# Patient Record
Sex: Male | Born: 1945 | Race: White | Hispanic: No | Marital: Married
Health system: Southern US, Community
[De-identification: ages and names within clinical notes are randomized; demographics above are authoritative.]

---

## 2003-03-18 ENCOUNTER — Inpatient Hospital Stay (HOSPITAL_COMMUNITY): Admission: EM | Admit: 2003-03-18 | Discharge: 2003-03-27 | Payer: Self-pay | Admitting: Psychiatry

## 2003-04-13 ENCOUNTER — Other Ambulatory Visit: Payer: Self-pay

## 2003-06-17 ENCOUNTER — Other Ambulatory Visit: Payer: Self-pay

## 2004-03-27 ENCOUNTER — Emergency Department: Payer: Self-pay | Admitting: Emergency Medicine

## 2004-12-25 ENCOUNTER — Emergency Department: Payer: Self-pay | Admitting: Emergency Medicine

## 2005-01-30 ENCOUNTER — Emergency Department: Payer: Self-pay | Admitting: General Practice

## 2006-01-10 ENCOUNTER — Inpatient Hospital Stay: Payer: Self-pay | Admitting: Internal Medicine

## 2006-01-10 ENCOUNTER — Other Ambulatory Visit: Payer: Self-pay

## 2006-09-07 ENCOUNTER — Ambulatory Visit: Payer: Self-pay | Admitting: Internal Medicine

## 2006-09-18 ENCOUNTER — Ambulatory Visit: Payer: Self-pay | Admitting: Internal Medicine

## 2007-01-26 ENCOUNTER — Emergency Department: Payer: Self-pay | Admitting: Internal Medicine

## 2007-01-26 ENCOUNTER — Other Ambulatory Visit: Payer: Self-pay

## 2007-04-17 ENCOUNTER — Emergency Department: Payer: Self-pay | Admitting: Emergency Medicine

## 2007-06-05 ENCOUNTER — Emergency Department: Payer: Self-pay | Admitting: Emergency Medicine

## 2007-06-05 ENCOUNTER — Other Ambulatory Visit: Payer: Self-pay

## 2007-08-04 ENCOUNTER — Emergency Department: Payer: Self-pay | Admitting: Emergency Medicine

## 2008-03-07 ENCOUNTER — Inpatient Hospital Stay: Payer: Self-pay | Admitting: Psychiatry

## 2008-07-03 ENCOUNTER — Inpatient Hospital Stay: Payer: Self-pay | Admitting: Internal Medicine

## 2008-11-04 ENCOUNTER — Ambulatory Visit: Payer: Self-pay | Admitting: Urology

## 2008-11-13 ENCOUNTER — Ambulatory Visit: Payer: Self-pay | Admitting: Urology

## 2009-12-31 ENCOUNTER — Inpatient Hospital Stay: Payer: Self-pay | Admitting: Internal Medicine

## 2010-05-04 ENCOUNTER — Ambulatory Visit: Payer: Self-pay | Admitting: Ophthalmology

## 2010-05-04 DIAGNOSIS — I119 Hypertensive heart disease without heart failure: Secondary | ICD-10-CM

## 2010-05-11 ENCOUNTER — Ambulatory Visit: Payer: Self-pay | Admitting: Ophthalmology

## 2010-08-31 ENCOUNTER — Emergency Department: Payer: Self-pay | Admitting: Emergency Medicine

## 2010-11-11 ENCOUNTER — Emergency Department: Payer: Self-pay | Admitting: Emergency Medicine

## 2011-01-12 ENCOUNTER — Emergency Department: Payer: Self-pay | Admitting: Emergency Medicine

## 2011-02-18 ENCOUNTER — Emergency Department: Payer: Self-pay | Admitting: Emergency Medicine

## 2011-07-12 LAB — CBC WITH DIFFERENTIAL/PLATELET
Basophil #: 0 10*3/uL (ref 0.0–0.1)
HCT: 41.8 % (ref 40.0–52.0)
HGB: 13.6 g/dL (ref 13.0–18.0)
MCH: 32.5 pg (ref 26.0–34.0)
MCHC: 32.6 g/dL (ref 32.0–36.0)
Monocyte #: 0.6 x10 3/mm (ref 0.2–1.0)
Platelet: 150 10*3/uL (ref 150–440)
RDW: 13.7 % (ref 11.5–14.5)
WBC: 6.5 10*3/uL (ref 3.8–10.6)

## 2011-07-12 LAB — URINALYSIS, COMPLETE
Bacteria: NONE SEEN
Blood: NEGATIVE
Leukocyte Esterase: NEGATIVE
Nitrite: NEGATIVE
Protein: 100
RBC,UR: 2 /HPF (ref 0–5)
Squamous Epithelial: NONE SEEN
WBC UR: 2 /HPF (ref 0–5)

## 2011-07-12 LAB — BASIC METABOLIC PANEL
BUN: 24 mg/dL — ABNORMAL HIGH (ref 7–18)
Creatinine: 1.39 mg/dL — ABNORMAL HIGH (ref 0.60–1.30)
EGFR (Non-African Amer.): 52 — ABNORMAL LOW
Glucose: 123 mg/dL — ABNORMAL HIGH (ref 65–99)
Potassium: 3.4 mmol/L — ABNORMAL LOW (ref 3.5–5.1)
Sodium: 141 mmol/L (ref 136–145)

## 2011-07-13 ENCOUNTER — Inpatient Hospital Stay: Payer: Self-pay | Admitting: Internal Medicine

## 2011-07-13 LAB — CBC WITH DIFFERENTIAL/PLATELET
Basophil #: 0 10*3/uL (ref 0.0–0.1)
Eosinophil #: 0 10*3/uL (ref 0.0–0.7)
Eosinophil #: 0 10*3/uL (ref 0.0–0.7)
Eosinophil %: 0.2 %
HCT: 41.1 % (ref 40.0–52.0)
HCT: 40.5 % (ref 40.0–52.0)
Lymphocyte #: 0.8 10*3/uL — ABNORMAL LOW (ref 1.0–3.6)
Lymphocyte #: 0.7 10*3/uL — ABNORMAL LOW (ref 1.0–3.6)
MCH: 33.5 pg (ref 26.0–34.0)
MCH: 33.6 pg (ref 26.0–34.0)
MCV: 99 fL (ref 80–100)
MCV: 100 fL (ref 80–100)
Monocyte %: 10.8 %
Monocyte %: 12.5 %
Neutrophil #: 4.5 10*3/uL (ref 1.4–6.5)
Neutrophil #: 4.1 10*3/uL (ref 1.4–6.5)
Neutrophil %: 74.7 %
Platelet: 150 10*3/uL (ref 150–440)
RBC: 4.06 10*6/uL — ABNORMAL LOW (ref 4.40–5.90)
RDW: 13.8 % (ref 11.5–14.5)
WBC: 5.9 10*3/uL (ref 3.8–10.6)
WBC: 5.4 10*3/uL (ref 3.8–10.6)

## 2011-07-13 LAB — URINALYSIS, COMPLETE
Bacteria: NONE SEEN
Ketone: NEGATIVE
Ph: 7 (ref 4.5–8.0)
Specific Gravity: 1.02 (ref 1.003–1.030)
Squamous Epithelial: NONE SEEN

## 2011-07-13 LAB — COMPREHENSIVE METABOLIC PANEL
Albumin: 3.8 g/dL (ref 3.4–5.0)
Anion Gap: 9 (ref 7–16)
Co2: 29 mmol/L (ref 21–32)
Glucose: 107 mg/dL — ABNORMAL HIGH (ref 65–99)
Potassium: 3.3 mmol/L — ABNORMAL LOW (ref 3.5–5.1)
SGPT (ALT): 13 U/L
Sodium: 139 mmol/L (ref 136–145)

## 2011-07-13 LAB — BASIC METABOLIC PANEL
BUN: 23 mg/dL — ABNORMAL HIGH (ref 7–18)
Calcium, Total: 8.3 mg/dL — ABNORMAL LOW (ref 8.5–10.1)
Chloride: 102 mmol/L (ref 98–107)
Creatinine: 1.32 mg/dL — ABNORMAL HIGH (ref 0.60–1.30)
EGFR (Non-African Amer.): 55 — ABNORMAL LOW
Glucose: 108 mg/dL — ABNORMAL HIGH (ref 65–99)
Potassium: 3.3 mmol/L — ABNORMAL LOW (ref 3.5–5.1)

## 2011-07-13 LAB — CK: CK, Total: 345 U/L — ABNORMAL HIGH (ref 35–232)

## 2011-07-13 LAB — PRO B NATRIURETIC PEPTIDE: B-Type Natriuretic Peptide: 617 pg/mL — ABNORMAL HIGH (ref 0–125)

## 2011-07-14 LAB — CK: CK, Total: 510 U/L — ABNORMAL HIGH (ref 35–232)

## 2011-07-14 LAB — URINE CULTURE

## 2011-07-15 LAB — AMMONIA: Ammonia, Plasma: <25 mcmol/L (ref 11–32)

## 2011-10-27 ENCOUNTER — Inpatient Hospital Stay: Payer: Self-pay | Admitting: Internal Medicine

## 2011-10-27 LAB — COMPREHENSIVE METABOLIC PANEL
Alkaline Phosphatase: 75 U/L (ref 50–136)
Anion Gap: 7 (ref 7–16)
Calcium, Total: 8.8 mg/dL (ref 8.5–10.1)
Chloride: 108 mmol/L — ABNORMAL HIGH (ref 98–107)
Co2: 27 mmol/L (ref 21–32)
Creatinine: 1.22 mg/dL (ref 0.60–1.30)
EGFR (African American): >60
EGFR (Non-African Amer.): >60
Potassium: 3.5 mmol/L (ref 3.5–5.1)
SGOT(AST): 11 U/L — ABNORMAL LOW (ref 15–37)
Total Protein: 7.5 g/dL (ref 6.4–8.2)

## 2011-10-27 LAB — CBC WITH DIFFERENTIAL/PLATELET
Basophil #: 0 10*3/uL (ref 0.0–0.1)
Basophil %: 0.2 %
Eosinophil #: 0.1 10*3/uL (ref 0.0–0.7)
Eosinophil %: 0.4 %
Lymphocyte #: 0.7 10*3/uL — ABNORMAL LOW (ref 1.0–3.6)
Lymphocyte %: 5 %
MCHC: 34.7 g/dL (ref 32.0–36.0)
Monocyte %: 3.1 %
Neutrophil #: 11.8 10*3/uL — ABNORMAL HIGH (ref 1.4–6.5)
Neutrophil %: 91.3 %
Platelet: 201 10*3/uL (ref 150–440)
RBC: 4.07 10*6/uL — ABNORMAL LOW (ref 4.40–5.90)
RDW: 13.1 % (ref 11.5–14.5)
WBC: 12.9 10*3/uL — ABNORMAL HIGH (ref 3.8–10.6)

## 2011-10-27 LAB — URINALYSIS, COMPLETE
Bilirubin,UR: NEGATIVE
Ketone: NEGATIVE
Ph: 7 (ref 4.5–8.0)
RBC,UR: 1 /HPF (ref 0–5)
Specific Gravity: 1.013 (ref 1.003–1.030)
Squamous Epithelial: NONE SEEN
WBC UR: <1 /HPF (ref 0–5)

## 2011-10-28 LAB — CBC WITH DIFFERENTIAL/PLATELET
Basophil %: 0.6 %
Eosinophil #: 0.1 10*3/uL (ref 0.0–0.7)
Eosinophil %: 1.1 %
HCT: 32.6 % — ABNORMAL LOW (ref 40.0–52.0)
HGB: 11.2 g/dL — ABNORMAL LOW (ref 13.0–18.0)
Lymphocyte %: 18.6 %
MCH: 34.1 pg — ABNORMAL HIGH (ref 26.0–34.0)
MCHC: 34.3 g/dL (ref 32.0–36.0)
MCV: 99 fL (ref 80–100)
Monocyte #: 0.5 x10 3/mm (ref 0.2–1.0)
Monocyte %: 4.4 %
Neutrophil #: 8.2 10*3/uL — ABNORMAL HIGH (ref 1.4–6.5)
RBC: 3.28 10*6/uL — ABNORMAL LOW (ref 4.40–5.90)
WBC: 10.8 10*3/uL — ABNORMAL HIGH (ref 3.8–10.6)

## 2011-10-28 LAB — BASIC METABOLIC PANEL
BUN: 18 mg/dL (ref 7–18)
Calcium, Total: 8.2 mg/dL — ABNORMAL LOW (ref 8.5–10.1)
Co2: 28 mmol/L (ref 21–32)
EGFR (African American): >60
EGFR (Non-African Amer.): >60
Glucose: 109 mg/dL — ABNORMAL HIGH (ref 65–99)
Osmolality: 293 (ref 275–301)
Potassium: 3.9 mmol/L (ref 3.5–5.1)

## 2011-10-28 LAB — VALPROIC ACID LEVEL: Valproic Acid: 56 ug/mL

## 2011-10-29 LAB — BASIC METABOLIC PANEL
Anion Gap: 6 — ABNORMAL LOW (ref 7–16)
Calcium, Total: 8.4 mg/dL — ABNORMAL LOW (ref 8.5–10.1)
Chloride: 111 mmol/L — ABNORMAL HIGH (ref 98–107)
Co2: 29 mmol/L (ref 21–32)
Creatinine: 0.97 mg/dL (ref 0.60–1.30)
EGFR (African American): >60
Osmolality: 291 (ref 275–301)
Potassium: 3.9 mmol/L (ref 3.5–5.1)
Sodium: 146 mmol/L — ABNORMAL HIGH (ref 136–145)

## 2011-10-29 LAB — CBC WITH DIFFERENTIAL/PLATELET
Basophil #: 0 10*3/uL (ref 0.0–0.1)
Eosinophil #: 0.2 10*3/uL (ref 0.0–0.7)
MCH: 33.7 pg (ref 26.0–34.0)
MCHC: 34.3 g/dL (ref 32.0–36.0)
Monocyte #: 0.5 x10 3/mm (ref 0.2–1.0)
Neutrophil %: 79.6 %
Platelet: 190 10*3/uL (ref 150–440)
RBC: 3.53 10*6/uL — ABNORMAL LOW (ref 4.40–5.90)

## 2011-10-30 LAB — CBC WITH DIFFERENTIAL/PLATELET
Basophil #: 0 10*3/uL (ref 0.0–0.1)
Basophil %: 0.4 %
Eosinophil #: 0.2 10*3/uL (ref 0.0–0.7)
HGB: 11.9 g/dL — ABNORMAL LOW (ref 13.0–18.0)
Lymphocyte #: 2 10*3/uL (ref 1.0–3.6)
MCH: 34.5 pg — ABNORMAL HIGH (ref 26.0–34.0)
MCHC: 35.1 g/dL (ref 32.0–36.0)
Monocyte #: 0.6 x10 3/mm (ref 0.2–1.0)
Neutrophil #: 5.6 10*3/uL (ref 1.4–6.5)
Neutrophil %: 65.8 %
RDW: 13 % (ref 11.5–14.5)

## 2011-10-30 LAB — BASIC METABOLIC PANEL
BUN: 20 mg/dL — ABNORMAL HIGH (ref 7–18)
Calcium, Total: 9.1 mg/dL (ref 8.5–10.1)
Co2: 28 mmol/L (ref 21–32)
Creatinine: 1.06 mg/dL (ref 0.60–1.30)
EGFR (African American): >60
Glucose: 136 mg/dL — ABNORMAL HIGH (ref 65–99)
Potassium: 4.1 mmol/L (ref 3.5–5.1)
Sodium: 144 mmol/L (ref 136–145)

## 2011-10-31 LAB — BASIC METABOLIC PANEL
Anion Gap: 6 — ABNORMAL LOW (ref 7–16)
Calcium, Total: 9.1 mg/dL (ref 8.5–10.1)
Chloride: 106 mmol/L (ref 98–107)
Co2: 30 mmol/L (ref 21–32)
Glucose: 114 mg/dL — ABNORMAL HIGH (ref 65–99)
Osmolality: 287 (ref 275–301)
Potassium: 4.1 mmol/L (ref 3.5–5.1)
Sodium: 142 mmol/L (ref 136–145)

## 2011-10-31 LAB — CBC WITH DIFFERENTIAL/PLATELET
Basophil %: 0.5 %
Eosinophil %: 4.6 %
HCT: 35.7 % — ABNORMAL LOW (ref 40.0–52.0)
Lymphocyte #: 1.8 10*3/uL (ref 1.0–3.6)
MCH: 33.7 pg (ref 26.0–34.0)
MCHC: 34.2 g/dL (ref 32.0–36.0)
MCV: 99 fL (ref 80–100)
Monocyte #: 0.5 x10 3/mm (ref 0.2–1.0)
Monocyte %: 7.9 %
Neutrophil #: 3.6 10*3/uL (ref 1.4–6.5)
Platelet: 223 10*3/uL (ref 150–440)
RBC: 3.63 10*6/uL — ABNORMAL LOW (ref 4.40–5.90)
WBC: 6.1 10*3/uL (ref 3.8–10.6)

## 2011-11-01 LAB — CULTURE, BLOOD (SINGLE)

## 2011-11-02 LAB — BASIC METABOLIC PANEL
BUN: 31 mg/dL — ABNORMAL HIGH (ref 7–18)
Calcium, Total: 9.2 mg/dL (ref 8.5–10.1)
Co2: 30 mmol/L (ref 21–32)
Creatinine: 1.13 mg/dL (ref 0.60–1.30)
EGFR (Non-African Amer.): >60
Glucose: 123 mg/dL — ABNORMAL HIGH (ref 65–99)
Potassium: 4.1 mmol/L (ref 3.5–5.1)
Sodium: 141 mmol/L (ref 136–145)

## 2011-11-02 LAB — CULTURE, BLOOD (SINGLE)

## 2011-11-27 LAB — URINE CULTURE

## 2011-11-29 ENCOUNTER — Observation Stay: Payer: Self-pay | Admitting: Internal Medicine

## 2011-11-29 LAB — COMPREHENSIVE METABOLIC PANEL
Albumin: 3.6 g/dL (ref 3.4–5.0)
Alkaline Phosphatase: 65 U/L (ref 50–136)
Anion Gap: 8 (ref 7–16)
BUN: 26 mg/dL — ABNORMAL HIGH (ref 7–18)
Bilirubin,Total: 0.8 mg/dL (ref 0.2–1.0)
Calcium, Total: 8.8 mg/dL (ref 8.5–10.1)
Co2: 28 mmol/L (ref 21–32)
Creatinine: 1.48 mg/dL — ABNORMAL HIGH (ref 0.60–1.30)
EGFR (African American): 56 — ABNORMAL LOW
EGFR (Non-African Amer.): 48 — ABNORMAL LOW
Osmolality: 297 (ref 275–301)
Potassium: 4 mmol/L (ref 3.5–5.1)
Sodium: 147 mmol/L — ABNORMAL HIGH (ref 136–145)
Total Protein: 7 g/dL (ref 6.4–8.2)

## 2011-11-29 LAB — CBC WITH DIFFERENTIAL/PLATELET
Basophil #: 0 10*3/uL (ref 0.0–0.1)
Basophil %: 0.3 %
Eosinophil %: 0.2 %
HCT: 38.2 % — ABNORMAL LOW (ref 40.0–52.0)
HGB: 13.5 g/dL (ref 13.0–18.0)
Lymphocyte %: 13.3 %
MCH: 34.8 pg — ABNORMAL HIGH (ref 26.0–34.0)
MCHC: 35.2 g/dL (ref 32.0–36.0)
MCV: 99 fL (ref 80–100)
Monocyte #: 0.7 x10 3/mm (ref 0.2–1.0)
Monocyte %: 5.1 %
Neutrophil %: 81.1 %
RBC: 3.87 10*6/uL — ABNORMAL LOW (ref 4.40–5.90)
WBC: 13.1 10*3/uL — ABNORMAL HIGH (ref 3.8–10.6)

## 2011-11-29 LAB — VALPROIC ACID LEVEL: Valproic Acid: 38 ug/mL — ABNORMAL LOW

## 2011-11-30 LAB — URINALYSIS, COMPLETE
Bacteria: NONE SEEN
Bilirubin,UR: NEGATIVE
Blood: NEGATIVE
Glucose,UR: NEGATIVE mg/dL (ref 0–75)
Hyaline Cast: 7
Leukocyte Esterase: NEGATIVE
Nitrite: NEGATIVE
Ph: 5 (ref 4.5–8.0)
Protein: 30
RBC,UR: 6 /HPF (ref 0–5)
Specific Gravity: 1.024 (ref 1.003–1.030)
Squamous Epithelial: 1

## 2011-11-30 LAB — BASIC METABOLIC PANEL
Anion Gap: 8 (ref 7–16)
BUN: 27 mg/dL — ABNORMAL HIGH (ref 7–18)
Calcium, Total: 8.5 mg/dL (ref 8.5–10.1)
Chloride: 111 mmol/L — ABNORMAL HIGH (ref 98–107)
Co2: 27 mmol/L (ref 21–32)
EGFR (African American): >60
Glucose: 99 mg/dL (ref 65–99)
Osmolality: 296 (ref 275–301)

## 2011-11-30 LAB — CBC WITH DIFFERENTIAL/PLATELET
Basophil %: 0.2 %
Eosinophil #: 0.1 10*3/uL (ref 0.0–0.7)
HCT: 34.2 % — ABNORMAL LOW (ref 40.0–52.0)
HGB: 11.9 g/dL — ABNORMAL LOW (ref 13.0–18.0)
MCH: 34.4 pg — ABNORMAL HIGH (ref 26.0–34.0)
MCHC: 34.8 g/dL (ref 32.0–36.0)
Monocyte #: 0.5 x10 3/mm (ref 0.2–1.0)
Neutrophil #: 4.9 10*3/uL (ref 1.4–6.5)
Neutrophil %: 65.9 %
Platelet: 126 10*3/uL — ABNORMAL LOW (ref 150–440)

## 2011-12-01 LAB — CBC WITH DIFFERENTIAL/PLATELET
Basophil #: 0 10*3/uL (ref 0.0–0.1)
Basophil %: 0.7 %
Eosinophil %: 3.8 %
HCT: 34.5 % — ABNORMAL LOW (ref 40.0–52.0)
Lymphocyte #: 1.6 10*3/uL (ref 1.0–3.6)
Lymphocyte %: 28 %
MCV: 99 fL (ref 80–100)
Monocyte #: 0.4 x10 3/mm (ref 0.2–1.0)
Monocyte %: 7.5 %
Platelet: 138 10*3/uL — ABNORMAL LOW (ref 150–440)
RBC: 3.48 10*6/uL — ABNORMAL LOW (ref 4.40–5.90)
WBC: 5.7 10*3/uL (ref 3.8–10.6)

## 2011-12-01 LAB — PHOSPHORUS: Phosphorus: 3.1 mg/dL (ref 2.5–4.9)

## 2011-12-01 LAB — URINE CULTURE

## 2011-12-01 LAB — BASIC METABOLIC PANEL
Calcium, Total: 8.4 mg/dL — ABNORMAL LOW (ref 8.5–10.1)
Co2: 26 mmol/L (ref 21–32)
EGFR (African American): >60
EGFR (Non-African Amer.): >60
Glucose: 113 mg/dL — ABNORMAL HIGH (ref 65–99)
Osmolality: 300 (ref 275–301)

## 2012-03-08 ENCOUNTER — Ambulatory Visit: Payer: Self-pay | Admitting: Internal Medicine

## 2012-08-08 ENCOUNTER — Emergency Department: Payer: Self-pay | Admitting: Emergency Medicine

## 2012-09-20 ENCOUNTER — Emergency Department: Payer: Self-pay | Admitting: Emergency Medicine

## 2012-09-20 LAB — BASIC METABOLIC PANEL
Anion Gap: 1 — ABNORMAL LOW (ref 7–16)
BUN: 25 mg/dL — ABNORMAL HIGH (ref 7–18)
Chloride: 107 mmol/L (ref 98–107)
Co2: 32 mmol/L (ref 21–32)
EGFR (African American): >60
EGFR (Non-African Amer.): >60
Glucose: 93 mg/dL (ref 65–99)

## 2012-09-20 LAB — URINALYSIS, COMPLETE
Bacteria: NONE SEEN
Bilirubin,UR: NEGATIVE
Glucose,UR: NEGATIVE mg/dL (ref 0–75)
Ketone: NEGATIVE
Leukocyte Esterase: NEGATIVE
Nitrite: NEGATIVE
Specific Gravity: 1.02 (ref 1.003–1.030)
Squamous Epithelial: <1

## 2012-09-20 LAB — CBC
HGB: 12.9 g/dL — ABNORMAL LOW (ref 13.0–18.0)
Platelet: 171 10*3/uL (ref 150–440)
RDW: 13 % (ref 11.5–14.5)
WBC: 4.7 10*3/uL (ref 3.8–10.6)

## 2012-09-20 LAB — PRO B NATRIURETIC PEPTIDE: B-Type Natriuretic Peptide: 222 pg/mL — ABNORMAL HIGH (ref 0–125)

## 2012-09-26 ENCOUNTER — Emergency Department: Payer: Self-pay | Admitting: Emergency Medicine

## 2012-09-26 LAB — COMPREHENSIVE METABOLIC PANEL
BUN: 28 mg/dL — ABNORMAL HIGH (ref 7–18)
Calcium, Total: 8.8 mg/dL (ref 8.5–10.1)
Chloride: 108 mmol/L — ABNORMAL HIGH (ref 98–107)
Co2: 28 mmol/L (ref 21–32)
Creatinine: 1.36 mg/dL — ABNORMAL HIGH (ref 0.60–1.30)
Glucose: 99 mg/dL (ref 65–99)
Potassium: 3.7 mmol/L (ref 3.5–5.1)
SGOT(AST): 19 U/L (ref 15–37)
SGPT (ALT): 15 U/L (ref 12–78)
Total Protein: 6.6 g/dL (ref 6.4–8.2)

## 2012-09-26 LAB — URINALYSIS, COMPLETE
Bacteria: NONE SEEN
Blood: NEGATIVE
Glucose,UR: NEGATIVE mg/dL (ref 0–75)
Ketone: NEGATIVE
Leukocyte Esterase: NEGATIVE
Protein: NEGATIVE
RBC,UR: <1 /HPF (ref 0–5)
Squamous Epithelial: NONE SEEN

## 2012-09-26 LAB — CBC
HCT: 33.1 % — ABNORMAL LOW (ref 40.0–52.0)
MCHC: 35.3 g/dL (ref 32.0–36.0)
MCV: 96 fL (ref 80–100)
Platelet: 152 10*3/uL (ref 150–440)
RBC: 3.43 10*6/uL — ABNORMAL LOW (ref 4.40–5.90)
RDW: 13 % (ref 11.5–14.5)

## 2012-09-30 ENCOUNTER — Emergency Department: Payer: Self-pay | Admitting: Emergency Medicine

## 2012-09-30 LAB — URINALYSIS, COMPLETE
Bacteria: NONE SEEN
Bilirubin,UR: NEGATIVE
Ketone: NEGATIVE
Nitrite: NEGATIVE
Ph: 7 (ref 4.5–8.0)
Protein: 100
Specific Gravity: 1.015 (ref 1.003–1.030)
Squamous Epithelial: <1
WBC UR: 3 /HPF (ref 0–5)

## 2012-09-30 LAB — CBC
HCT: 35.9 % — ABNORMAL LOW (ref 40.0–52.0)
HGB: 12.7 g/dL — ABNORMAL LOW (ref 13.0–18.0)
MCH: 34.5 pg — ABNORMAL HIGH (ref 26.0–34.0)
Platelet: 147 10*3/uL — ABNORMAL LOW (ref 150–440)
RDW: 12.9 % (ref 11.5–14.5)
WBC: 8.4 10*3/uL (ref 3.8–10.6)

## 2012-09-30 LAB — COMPREHENSIVE METABOLIC PANEL
Albumin: 4 g/dL (ref 3.4–5.0)
Anion Gap: 3 — ABNORMAL LOW (ref 7–16)
Bilirubin,Total: 0.4 mg/dL (ref 0.2–1.0)
Chloride: 109 mmol/L — ABNORMAL HIGH (ref 98–107)
EGFR (African American): >60
EGFR (Non-African Amer.): >60
Glucose: 146 mg/dL — ABNORMAL HIGH (ref 65–99)
Osmolality: 289 (ref 275–301)
Potassium: 3.7 mmol/L (ref 3.5–5.1)
SGPT (ALT): 19 U/L (ref 12–78)
Total Protein: 7.4 g/dL (ref 6.4–8.2)

## 2012-09-30 LAB — CK TOTAL AND CKMB (NOT AT ARMC): CK, Total: 1631 U/L — ABNORMAL HIGH (ref 35–232)

## 2012-09-30 LAB — PRO B NATRIURETIC PEPTIDE: B-Type Natriuretic Peptide: 470 pg/mL — ABNORMAL HIGH (ref 0–125)

## 2012-09-30 LAB — TROPONIN I: Troponin-I: 0.02 ng/mL

## 2012-10-15 LAB — URINALYSIS, COMPLETE
Bacteria: NONE SEEN
Blood: NEGATIVE
Glucose,UR: 50 mg/dL (ref 0–75)
Ketone: NEGATIVE
Leukocyte Esterase: NEGATIVE
Ph: 6 (ref 4.5–8.0)
Protein: NEGATIVE
Specific Gravity: 1.016 (ref 1.003–1.030)

## 2012-10-15 LAB — CBC
HCT: 33.8 % — ABNORMAL LOW (ref 40.0–52.0)
MCH: 34.7 pg — ABNORMAL HIGH (ref 26.0–34.0)
MCHC: 35.3 g/dL (ref 32.0–36.0)
MCV: 98 fL (ref 80–100)
Platelet: 186 10*3/uL (ref 150–440)
RBC: 3.44 10*6/uL — ABNORMAL LOW (ref 4.40–5.90)
WBC: 5 10*3/uL (ref 3.8–10.6)

## 2012-10-15 LAB — BASIC METABOLIC PANEL
Anion Gap: 5 — ABNORMAL LOW (ref 7–16)
BUN: 25 mg/dL — ABNORMAL HIGH (ref 7–18)
Co2: 29 mmol/L (ref 21–32)
Creatinine: 1.16 mg/dL (ref 0.60–1.30)
EGFR (African American): >60
Potassium: 3.6 mmol/L (ref 3.5–5.1)
Sodium: 142 mmol/L (ref 136–145)

## 2012-10-15 LAB — ETHANOL: Ethanol: <3 mg/dL

## 2012-10-15 LAB — TROPONIN I: Troponin-I: <0.02 ng/mL

## 2012-10-16 ENCOUNTER — Inpatient Hospital Stay: Payer: Self-pay | Admitting: Psychiatry

## 2012-10-16 LAB — URINALYSIS, COMPLETE
Bacteria: NONE SEEN
Bilirubin,UR: NEGATIVE
Blood: NEGATIVE
Glucose,UR: NEGATIVE mg/dL (ref 0–75)
Ketone: NEGATIVE
Leukocyte Esterase: NEGATIVE
Nitrite: NEGATIVE
Ph: 6 (ref 4.5–8.0)
Protein: 30
RBC,UR: 1 /HPF (ref 0–5)
Specific Gravity: 1.013 (ref 1.003–1.030)
Squamous Epithelial: <1
WBC UR: 1 /HPF (ref 0–5)

## 2012-10-16 LAB — HEPATIC FUNCTION PANEL A (ARMC)
Albumin: 3.7 g/dL (ref 3.4–5.0)
Alkaline Phosphatase: 73 U/L (ref 50–136)
Bilirubin, Direct: <0.1 mg/dL (ref 0.00–0.20)
SGPT (ALT): 19 U/L (ref 12–78)

## 2012-10-16 LAB — PROTIME-INR: INR: 1

## 2012-10-16 LAB — APTT: Activated PTT: 31.6 secs (ref 23.6–35.9)

## 2012-10-16 LAB — SALICYLATE LEVEL: Salicylates, Serum: <1.7 mg/dL

## 2012-10-17 LAB — BEHAVIORAL MEDICINE 1 PANEL
Albumin: 3.5 g/dL
Alkaline Phosphatase: 69 U/L
Anion Gap: 3 — ABNORMAL LOW
BUN: 18 mg/dL
Basophil #: 0 x10 3/mm 3
Basophil %: 0.7 %
Bilirubin,Total: 0.4 mg/dL
Calcium, Total: 9.1 mg/dL
Chloride: 107 mmol/L
Co2: 31 mmol/L
Creatinine: 1.1 mg/dL
EGFR (African American): >60
EGFR (Non-African Amer.): >60
Eosinophil #: 0.1 x10 3/mm 3
Eosinophil %: 2.9 %
Glucose: 112 mg/dL — ABNORMAL HIGH
HCT: 34.5 % — ABNORMAL LOW
HGB: 12.2 g/dL — ABNORMAL LOW
Lymphocyte %: 41.1 %
Lymphs Abs: 1.8 x10 3/mm 3
MCH: 34.4 pg — ABNORMAL HIGH
MCHC: 35.3 g/dL
MCV: 97 fL
Monocyte #: 0.4 "x10 3/mm "
Monocyte %: 8.8 %
Neutrophil #: 2 x10 3/mm 3
Neutrophil %: 46.5 %
Osmolality: 284
Platelet: 180 x10 3/mm 3
Potassium: 4.1 mmol/L
RBC: 3.55 x10 6/mm 3 — ABNORMAL LOW
RDW: 13.1 %
SGOT(AST): 17 U/L
SGPT (ALT): 16 U/L
Sodium: 141 mmol/L
Thyroid Stimulating Horm: 0.78 u[IU]/mL
Total Protein: 6.8 g/dL
WBC: 4.3 x10 3/mm 3

## 2012-10-25 ENCOUNTER — Observation Stay: Payer: Self-pay | Admitting: Internal Medicine

## 2012-10-25 LAB — CBC WITH DIFFERENTIAL/PLATELET
Basophil %: 0.5 %
HGB: 11.5 g/dL — ABNORMAL LOW (ref 13.0–18.0)
Lymphocyte #: 1.4 10*3/uL (ref 1.0–3.6)
Lymphocyte %: 27.3 %
MCH: 34.4 pg — ABNORMAL HIGH (ref 26.0–34.0)
MCHC: 35 g/dL (ref 32.0–36.0)
Monocyte #: 0.5 x10 3/mm (ref 0.2–1.0)
Neutrophil %: 62.2 %
Platelet: 179 10*3/uL (ref 150–440)
RBC: 3.33 10*6/uL — ABNORMAL LOW (ref 4.40–5.90)
RDW: 12.6 % (ref 11.5–14.5)
WBC: 5 10*3/uL (ref 3.8–10.6)

## 2012-10-25 LAB — BASIC METABOLIC PANEL
BUN: 33 mg/dL — ABNORMAL HIGH (ref 7–18)
Calcium, Total: 9 mg/dL (ref 8.5–10.1)
Chloride: 106 mmol/L (ref 98–107)
Creatinine: 1.45 mg/dL — ABNORMAL HIGH (ref 0.60–1.30)
EGFR (African American): 57 — ABNORMAL LOW
EGFR (Non-African Amer.): 49 — ABNORMAL LOW
Sodium: 140 mmol/L (ref 136–145)

## 2012-10-25 LAB — PRO B NATRIURETIC PEPTIDE: B-Type Natriuretic Peptide: 122 pg/mL (ref 0–125)

## 2012-10-25 LAB — TROPONIN I: Troponin-I: <0.02 ng/mL

## 2012-10-26 LAB — BASIC METABOLIC PANEL
BUN: 32 mg/dL — ABNORMAL HIGH (ref 7–18)
Calcium, Total: 8.8 mg/dL (ref 8.5–10.1)
Chloride: 108 mmol/L — ABNORMAL HIGH (ref 98–107)
Creatinine: 1.24 mg/dL (ref 0.60–1.30)
EGFR (African American): >60
Osmolality: 291 (ref 275–301)
Potassium: 3.8 mmol/L (ref 3.5–5.1)
Sodium: 143 mmol/L (ref 136–145)

## 2012-10-26 LAB — CBC WITH DIFFERENTIAL/PLATELET
Basophil #: 0 10*3/uL (ref 0.0–0.1)
Basophil %: 0.6 %
Eosinophil #: 0.1 10*3/uL (ref 0.0–0.7)
Eosinophil %: 1.5 %
HCT: 32.9 % — ABNORMAL LOW (ref 40.0–52.0)
HGB: 11.3 g/dL — ABNORMAL LOW (ref 13.0–18.0)
Lymphocyte #: 2 10*3/uL (ref 1.0–3.6)
Lymphocyte %: 46.1 %
Monocyte #: 0.4 x10 3/mm (ref 0.2–1.0)
Monocyte %: 10 %
Neutrophil #: 1.8 10*3/uL (ref 1.4–6.5)
Neutrophil %: 41.8 %
Platelet: 163 10*3/uL (ref 150–440)
RDW: 12.8 % (ref 11.5–14.5)
WBC: 4.3 10*3/uL (ref 3.8–10.6)

## 2012-10-26 LAB — TROPONIN I
Troponin-I: <0.02 ng/mL
Troponin-I: <0.02 ng/mL

## 2012-12-29 ENCOUNTER — Inpatient Hospital Stay: Payer: Self-pay | Admitting: Psychiatry

## 2012-12-29 LAB — URINALYSIS, COMPLETE
Bacteria: NONE SEEN
Blood: NEGATIVE
Ketone: NEGATIVE
Leukocyte Esterase: NEGATIVE
Ph: 7 (ref 4.5–8.0)
Protein: 30
RBC,UR: 1 /HPF (ref 0–5)
Specific Gravity: 1.014 (ref 1.003–1.030)
Squamous Epithelial: NONE SEEN
WBC UR: 1 /HPF (ref 0–5)

## 2012-12-29 LAB — COMPREHENSIVE METABOLIC PANEL
Anion Gap: 3 — ABNORMAL LOW (ref 7–16)
BUN: 27 mg/dL — ABNORMAL HIGH (ref 7–18)
Chloride: 107 mmol/L (ref 98–107)
Co2: 31 mmol/L (ref 21–32)
Creatinine: 1.26 mg/dL (ref 0.60–1.30)
EGFR (Non-African Amer.): 58 — ABNORMAL LOW
Glucose: 92 mg/dL (ref 65–99)
Osmolality: 286 (ref 275–301)
Potassium: 3.4 mmol/L — ABNORMAL LOW (ref 3.5–5.1)
SGOT(AST): 27 U/L (ref 15–37)
Total Protein: 7.8 g/dL (ref 6.4–8.2)

## 2012-12-29 LAB — DRUG SCREEN, URINE
Amphetamines, Ur Screen: NEGATIVE (ref ?–1000)
Barbiturates, Ur Screen: NEGATIVE (ref ?–200)
Benzodiazepine, Ur Scrn: NEGATIVE (ref ?–200)
Cocaine Metabolite,Ur ~~LOC~~: NEGATIVE (ref ?–300)
Methadone, Ur Screen: NEGATIVE (ref ?–300)
Opiate, Ur Screen: NEGATIVE (ref ?–300)
Phencyclidine (PCP) Ur S: NEGATIVE (ref ?–25)
Tricyclic, Ur Screen: NEGATIVE (ref ?–1000)

## 2012-12-29 LAB — ETHANOL: Ethanol %: <0.003 % (ref 0.000–0.080)

## 2012-12-29 LAB — CBC
HGB: 13.3 g/dL (ref 13.0–18.0)
Platelet: 167 10*3/uL (ref 150–440)
RBC: 3.97 10*6/uL — ABNORMAL LOW (ref 4.40–5.90)

## 2012-12-29 LAB — ACETAMINOPHEN LEVEL: Acetaminophen: <2 ug/mL

## 2013-01-02 LAB — BASIC METABOLIC PANEL
BUN: 36 mg/dL — ABNORMAL HIGH (ref 7–18)
Calcium, Total: 9.3 mg/dL (ref 8.5–10.1)
Co2: 32 mmol/L (ref 21–32)
Creatinine: 1.42 mg/dL — ABNORMAL HIGH (ref 0.60–1.30)
EGFR (Non-African Amer.): 50 — ABNORMAL LOW
Glucose: 105 mg/dL — ABNORMAL HIGH (ref 65–99)
Sodium: 142 mmol/L (ref 136–145)

## 2013-01-11 ENCOUNTER — Inpatient Hospital Stay: Payer: Self-pay | Admitting: Internal Medicine

## 2013-01-11 LAB — CBC
HGB: 13.3 g/dL (ref 13.0–18.0)
MCHC: 33.8 g/dL (ref 32.0–36.0)
Platelet: 154 10*3/uL (ref 150–440)
RBC: 3.96 10*6/uL — ABNORMAL LOW (ref 4.40–5.90)
WBC: 9.6 10*3/uL (ref 3.8–10.6)

## 2013-01-11 LAB — URINALYSIS, COMPLETE
Bacteria: NONE SEEN
Bilirubin,UR: NEGATIVE
Glucose,UR: NEGATIVE mg/dL (ref 0–75)
Leukocyte Esterase: NEGATIVE
Ph: 5 (ref 4.5–8.0)
Squamous Epithelial: 1
WBC UR: 1 /HPF (ref 0–5)

## 2013-01-11 LAB — COMPREHENSIVE METABOLIC PANEL
Anion Gap: 7 (ref 7–16)
BUN: 75 mg/dL — ABNORMAL HIGH (ref 7–18)
Calcium, Total: 8.9 mg/dL (ref 8.5–10.1)
Co2: 24 mmol/L (ref 21–32)
EGFR (Non-African Amer.): 25 — ABNORMAL LOW
Glucose: 128 mg/dL — ABNORMAL HIGH (ref 65–99)
Osmolality: 320 (ref 275–301)
Potassium: 3.5 mmol/L (ref 3.5–5.1)
Sodium: 149 mmol/L — ABNORMAL HIGH (ref 136–145)
Total Protein: 7.2 g/dL (ref 6.4–8.2)

## 2013-01-11 LAB — TROPONIN I
Troponin-I: 0.11 ng/mL — ABNORMAL HIGH
Troponin-I: 0.1 ng/mL — ABNORMAL HIGH

## 2013-01-11 LAB — CK TOTAL AND CKMB (NOT AT ARMC): CK, Total: 6718 U/L — ABNORMAL HIGH (ref 35–232)

## 2013-01-12 LAB — CBC WITH DIFFERENTIAL/PLATELET
Basophil #: 0 10*3/uL (ref 0.0–0.1)
Basophil %: 0.4 %
Eosinophil #: 0.1 10*3/uL (ref 0.0–0.7)
HCT: 32.9 % — ABNORMAL LOW (ref 40.0–52.0)
Lymphocyte #: 1.8 10*3/uL (ref 1.0–3.6)
Lymphocyte %: 28 %
MCH: 33.3 pg (ref 26.0–34.0)
Platelet: 120 10*3/uL — ABNORMAL LOW (ref 150–440)
RDW: 12.9 % (ref 11.5–14.5)
WBC: 6.3 10*3/uL (ref 3.8–10.6)

## 2013-01-12 LAB — BASIC METABOLIC PANEL
BUN: 63 mg/dL — ABNORMAL HIGH (ref 7–18)
Calcium, Total: 8.6 mg/dL (ref 8.5–10.1)
Chloride: 119 mmol/L — ABNORMAL HIGH (ref 98–107)
Co2: 29 mmol/L (ref 21–32)
Creatinine: 1.73 mg/dL — ABNORMAL HIGH (ref 0.60–1.30)
Glucose: 112 mg/dL — ABNORMAL HIGH (ref 65–99)
Sodium: 150 mmol/L — ABNORMAL HIGH (ref 136–145)

## 2013-01-12 LAB — TSH: Thyroid Stimulating Horm: 0.779 u[IU]/mL

## 2013-01-13 LAB — CBC WITH DIFFERENTIAL/PLATELET
Basophil %: 0.3 %
Eosinophil %: 2.5 %
HCT: 34.6 % — ABNORMAL LOW (ref 40.0–52.0)
HGB: 11.8 g/dL — ABNORMAL LOW (ref 13.0–18.0)
Lymphocyte %: 24.7 %
MCH: 33.4 pg (ref 26.0–34.0)
MCHC: 34.2 g/dL (ref 32.0–36.0)
MCV: 98 fL (ref 80–100)
Monocyte #: 0.5 x10 3/mm (ref 0.2–1.0)
Monocyte %: 7.5 %
Neutrophil #: 4.2 10*3/uL (ref 1.4–6.5)
Neutrophil %: 65 %
Platelet: 118 10*3/uL — ABNORMAL LOW (ref 150–440)
RBC: 3.54 10*6/uL — ABNORMAL LOW (ref 4.40–5.90)
RDW: 12.9 % (ref 11.5–14.5)
WBC: 6.5 10*3/uL (ref 3.8–10.6)

## 2013-01-13 LAB — COMPREHENSIVE METABOLIC PANEL
Albumin: 3 g/dL — ABNORMAL LOW (ref 3.4–5.0)
Alkaline Phosphatase: 53 U/L
Anion Gap: 5 — ABNORMAL LOW (ref 7–16)
Bilirubin,Total: 0.5 mg/dL (ref 0.2–1.0)
Calcium, Total: 8.5 mg/dL (ref 8.5–10.1)
Chloride: 114 mmol/L — ABNORMAL HIGH (ref 98–107)
Co2: 28 mmol/L (ref 21–32)
EGFR (Non-African Amer.): 60 — ABNORMAL LOW
Glucose: 147 mg/dL — ABNORMAL HIGH (ref 65–99)
Potassium: 3.7 mmol/L (ref 3.5–5.1)
SGOT(AST): 92 U/L — ABNORMAL HIGH (ref 15–37)
Total Protein: 6.1 g/dL — ABNORMAL LOW (ref 6.4–8.2)

## 2013-01-13 LAB — CK: CK, Total: 2081 U/L — ABNORMAL HIGH (ref 35–232)

## 2013-01-14 LAB — BASIC METABOLIC PANEL
Anion Gap: 2 — ABNORMAL LOW (ref 7–16)
BUN: 20 mg/dL — ABNORMAL HIGH (ref 7–18)
Calcium, Total: 8.6 mg/dL (ref 8.5–10.1)
Co2: 30 mmol/L (ref 21–32)
Creatinine: 1.06 mg/dL (ref 0.60–1.30)
Glucose: 125 mg/dL — ABNORMAL HIGH (ref 65–99)

## 2013-01-14 LAB — CK: CK, Total: 694 U/L — ABNORMAL HIGH (ref 35–232)

## 2013-01-14 LAB — PLATELET COUNT: Platelet: 120 10*3/uL — ABNORMAL LOW (ref 150–440)

## 2013-05-10 ENCOUNTER — Emergency Department: Payer: Self-pay | Admitting: Emergency Medicine

## 2013-05-10 LAB — URINALYSIS, COMPLETE
BILIRUBIN, UR: NEGATIVE
Bacteria: NONE SEEN
Glucose,UR: NEGATIVE mg/dL (ref 0–75)
Hyaline Cast: 39
Leukocyte Esterase: NEGATIVE
Nitrite: NEGATIVE
Ph: 5 (ref 4.5–8.0)
Protein: NEGATIVE
RBC,UR: 5 /HPF (ref 0–5)
Specific Gravity: 1.013 (ref 1.003–1.030)

## 2013-05-10 LAB — COMPREHENSIVE METABOLIC PANEL
ALBUMIN: 5 g/dL (ref 3.4–5.0)
ANION GAP: 9 (ref 7–16)
Alkaline Phosphatase: 102 U/L
BILIRUBIN TOTAL: 1.2 mg/dL — AB (ref 0.2–1.0)
BUN: 30 mg/dL — ABNORMAL HIGH (ref 7–18)
CALCIUM: 9.5 mg/dL (ref 8.5–10.1)
CHLORIDE: 104 mmol/L (ref 98–107)
CREATININE: 1.95 mg/dL — AB (ref 0.60–1.30)
Co2: 27 mmol/L (ref 21–32)
EGFR (African American): 40 — ABNORMAL LOW
EGFR (Non-African Amer.): 34 — ABNORMAL LOW
GLUCOSE: 76 mg/dL (ref 65–99)
OSMOLALITY: 284 (ref 275–301)
Potassium: 3.5 mmol/L (ref 3.5–5.1)
SGOT(AST): 24 U/L (ref 15–37)
SGPT (ALT): 13 U/L (ref 12–78)
SODIUM: 140 mmol/L (ref 136–145)
TOTAL PROTEIN: 9.1 g/dL — AB (ref 6.4–8.2)

## 2013-05-10 LAB — CBC WITH DIFFERENTIAL/PLATELET
BASOS ABS: 0 10*3/uL (ref 0.0–0.1)
Basophil %: 0.6 %
Eosinophil #: 0 10*3/uL (ref 0.0–0.7)
Eosinophil %: 0.5 %
HCT: 44.7 % (ref 40.0–52.0)
HGB: 15.1 g/dL (ref 13.0–18.0)
LYMPHS ABS: 2.5 10*3/uL (ref 1.0–3.6)
Lymphocyte %: 33 %
MCH: 32.5 pg (ref 26.0–34.0)
MCHC: 33.7 g/dL (ref 32.0–36.0)
MCV: 96 fL (ref 80–100)
MONOS PCT: 5.5 %
Monocyte #: 0.4 x10 3/mm (ref 0.2–1.0)
NEUTROS ABS: 4.6 10*3/uL (ref 1.4–6.5)
Neutrophil %: 60.4 %
Platelet: 173 10*3/uL (ref 150–440)
RBC: 4.65 10*6/uL (ref 4.40–5.90)
RDW: 12.9 % (ref 11.5–14.5)
WBC: 7.6 10*3/uL (ref 3.8–10.6)

## 2013-05-10 LAB — LIPASE, BLOOD: LIPASE: 238 U/L (ref 73–393)

## 2013-08-07 ENCOUNTER — Emergency Department: Payer: Self-pay | Admitting: Emergency Medicine

## 2013-08-07 LAB — URINALYSIS, COMPLETE
BILIRUBIN, UR: NEGATIVE
Bacteria: NONE SEEN
Glucose,UR: 50 mg/dL (ref 0–75)
Ketone: NEGATIVE
LEUKOCYTE ESTERASE: NEGATIVE
Nitrite: NEGATIVE
Ph: 6 (ref 4.5–8.0)
Protein: 30
SPECIFIC GRAVITY: 1.011 (ref 1.003–1.030)

## 2013-08-07 LAB — ETHANOL: Ethanol %: <0.003 % (ref 0.000–0.080)

## 2013-08-07 LAB — COMPREHENSIVE METABOLIC PANEL
Albumin: 4.1 g/dL (ref 3.4–5.0)
Alkaline Phosphatase: 88 U/L
Anion Gap: 6 — ABNORMAL LOW (ref 7–16)
BILIRUBIN TOTAL: 0.5 mg/dL (ref 0.2–1.0)
BUN: 21 mg/dL — ABNORMAL HIGH (ref 7–18)
CREATININE: 1.2 mg/dL (ref 0.60–1.30)
Calcium, Total: 8.3 mg/dL — ABNORMAL LOW (ref 8.5–10.1)
Chloride: 107 mmol/L (ref 98–107)
Co2: 27 mmol/L (ref 21–32)
EGFR (African American): >60
EGFR (Non-African Amer.): >60
Glucose: 109 mg/dL — ABNORMAL HIGH (ref 65–99)
Osmolality: 283 (ref 275–301)
Potassium: 3.4 mmol/L — ABNORMAL LOW (ref 3.5–5.1)
SGOT(AST): 23 U/L (ref 15–37)
SGPT (ALT): 14 U/L (ref 12–78)
Sodium: 140 mmol/L (ref 136–145)
TOTAL PROTEIN: 7.8 g/dL (ref 6.4–8.2)

## 2013-08-07 LAB — CBC
HCT: 35.1 % — ABNORMAL LOW (ref 40.0–52.0)
HGB: 11.9 g/dL — AB (ref 13.0–18.0)
MCH: 33.3 pg (ref 26.0–34.0)
MCHC: 33.8 g/dL (ref 32.0–36.0)
MCV: 99 fL (ref 80–100)
PLATELETS: 168 10*3/uL (ref 150–440)
RBC: 3.56 10*6/uL — AB (ref 4.40–5.90)
RDW: 13.1 % (ref 11.5–14.5)
WBC: 5.6 10*3/uL (ref 3.8–10.6)

## 2013-08-07 LAB — DRUG SCREEN, URINE
AMPHETAMINES, UR SCREEN: NEGATIVE (ref ?–1000)
BENZODIAZEPINE, UR SCRN: NEGATIVE (ref ?–200)
Barbiturates, Ur Screen: NEGATIVE (ref ?–200)
COCAINE METABOLITE, UR ~~LOC~~: NEGATIVE (ref ?–300)
Cannabinoid 50 Ng, Ur ~~LOC~~: NEGATIVE (ref ?–50)
MDMA (Ecstasy)Ur Screen: NEGATIVE (ref ?–500)
Methadone, Ur Screen: NEGATIVE (ref ?–300)
Opiate, Ur Screen: NEGATIVE (ref ?–300)
Phencyclidine (PCP) Ur S: NEGATIVE (ref ?–25)
Tricyclic, Ur Screen: NEGATIVE (ref ?–1000)

## 2013-08-07 LAB — SALICYLATE LEVEL: Salicylates, Serum: <1.7 mg/dL

## 2013-08-07 LAB — ACETAMINOPHEN LEVEL

## 2013-08-24 ENCOUNTER — Inpatient Hospital Stay: Payer: Self-pay | Admitting: Internal Medicine

## 2013-08-24 LAB — CBC
HCT: 38.9 % — AB (ref 40.0–52.0)
HGB: 12.7 g/dL — ABNORMAL LOW (ref 13.0–18.0)
MCH: 32.5 pg (ref 26.0–34.0)
MCHC: 32.5 g/dL (ref 32.0–36.0)
MCV: 100 fL (ref 80–100)
Platelet: 200 10*3/uL (ref 150–440)
RBC: 3.9 10*6/uL — AB (ref 4.40–5.90)
RDW: 13 % (ref 11.5–14.5)
WBC: 10.5 10*3/uL (ref 3.8–10.6)

## 2013-08-24 LAB — LIPASE, BLOOD: Lipase: 185 U/L (ref 73–393)

## 2013-08-24 LAB — COMPREHENSIVE METABOLIC PANEL
ALBUMIN: 3.9 g/dL (ref 3.4–5.0)
AST: 20 U/L (ref 15–37)
Alkaline Phosphatase: 85 U/L
Anion Gap: 5 — ABNORMAL LOW (ref 7–16)
BILIRUBIN TOTAL: 0.5 mg/dL (ref 0.2–1.0)
BUN: 22 mg/dL — ABNORMAL HIGH (ref 7–18)
CALCIUM: 8.8 mg/dL (ref 8.5–10.1)
CO2: 28 mmol/L (ref 21–32)
CREATININE: 1.52 mg/dL — AB (ref 0.60–1.30)
Chloride: 110 mmol/L — ABNORMAL HIGH (ref 98–107)
EGFR (Non-African Amer.): 46 — ABNORMAL LOW
GFR CALC AF AMER: 53 — AB
GLUCOSE: 155 mg/dL — AB (ref 65–99)
Osmolality: 291 (ref 275–301)
Potassium: 4.2 mmol/L (ref 3.5–5.1)
SGPT (ALT): 19 U/L (ref 12–78)
Sodium: 143 mmol/L (ref 136–145)
Total Protein: 7.8 g/dL (ref 6.4–8.2)

## 2013-08-24 LAB — URINALYSIS, COMPLETE
BACTERIA: NEGATIVE
BILIRUBIN, UR: NEGATIVE
Glucose,UR: 50 mg/dL (ref 0–75)
Ketone: NEGATIVE
Leukocyte Esterase: NEGATIVE
Nitrite: NEGATIVE
Ph: 6 (ref 4.5–8.0)
Protein: 30
Specific Gravity: 1.012 (ref 1.003–1.030)

## 2013-08-25 ENCOUNTER — Ambulatory Visit: Payer: Self-pay | Admitting: Urology

## 2013-08-25 LAB — CBC WITH DIFFERENTIAL/PLATELET
BASOS ABS: 0 10*3/uL (ref 0.0–0.1)
Basophil %: 0.1 %
EOS PCT: 0.5 %
Eosinophil #: 0 10*3/uL (ref 0.0–0.7)
HCT: 37.6 % — ABNORMAL LOW (ref 40.0–52.0)
HGB: 12.7 g/dL — AB (ref 13.0–18.0)
LYMPHS ABS: 1.5 10*3/uL (ref 1.0–3.6)
Lymphocyte %: 14.5 %
MCH: 33.8 pg (ref 26.0–34.0)
MCHC: 33.8 g/dL (ref 32.0–36.0)
MCV: 100 fL (ref 80–100)
MONOS PCT: 9.1 %
Monocyte #: 0.9 x10 3/mm (ref 0.2–1.0)
NEUTROS ABS: 7.8 10*3/uL — AB (ref 1.4–6.5)
Neutrophil %: 75.8 %
Platelet: 176 10*3/uL (ref 150–440)
RBC: 3.77 10*6/uL — AB (ref 4.40–5.90)
RDW: 13.5 % (ref 11.5–14.5)
WBC: 10.3 10*3/uL (ref 3.8–10.6)

## 2013-08-25 LAB — BASIC METABOLIC PANEL
Anion Gap: 5 — ABNORMAL LOW (ref 7–16)
BUN: 19 mg/dL — AB (ref 7–18)
Calcium, Total: 8.5 mg/dL (ref 8.5–10.1)
Chloride: 108 mmol/L — ABNORMAL HIGH (ref 98–107)
Co2: 30 mmol/L (ref 21–32)
Creatinine: 1.54 mg/dL — ABNORMAL HIGH (ref 0.60–1.30)
EGFR (African American): 52 — ABNORMAL LOW
EGFR (Non-African Amer.): 45 — ABNORMAL LOW
Glucose: 106 mg/dL — ABNORMAL HIGH (ref 65–99)
OSMOLALITY: 288 (ref 275–301)
Potassium: 3.7 mmol/L (ref 3.5–5.1)
Sodium: 143 mmol/L (ref 136–145)

## 2013-08-25 LAB — MAGNESIUM: MAGNESIUM: 2.3 mg/dL

## 2013-08-26 LAB — CBC WITH DIFFERENTIAL/PLATELET
BASOS PCT: 0.2 %
Basophil #: 0 10*3/uL (ref 0.0–0.1)
Eosinophil #: 0.1 10*3/uL (ref 0.0–0.7)
Eosinophil %: 1.2 %
HCT: 34 % — ABNORMAL LOW (ref 40.0–52.0)
HGB: 11.1 g/dL — ABNORMAL LOW (ref 13.0–18.0)
LYMPHS PCT: 19.6 %
Lymphocyte #: 1.2 10*3/uL (ref 1.0–3.6)
MCH: 33.2 pg (ref 26.0–34.0)
MCHC: 32.7 g/dL (ref 32.0–36.0)
MCV: 101 fL — AB (ref 80–100)
MONO ABS: 0.5 x10 3/mm (ref 0.2–1.0)
Monocyte %: 8.9 %
NEUTROS ABS: 4.3 10*3/uL (ref 1.4–6.5)
Neutrophil %: 70.1 %
Platelet: 166 10*3/uL (ref 150–440)
RBC: 3.36 10*6/uL — AB (ref 4.40–5.90)
RDW: 12.9 % (ref 11.5–14.5)
WBC: 6.1 10*3/uL (ref 3.8–10.6)

## 2013-08-26 LAB — BASIC METABOLIC PANEL
Anion Gap: 6 — ABNORMAL LOW (ref 7–16)
BUN: 16 mg/dL (ref 7–18)
CREATININE: 1.53 mg/dL — AB (ref 0.60–1.30)
Calcium, Total: 8.3 mg/dL — ABNORMAL LOW (ref 8.5–10.1)
Chloride: 110 mmol/L — ABNORMAL HIGH (ref 98–107)
Co2: 27 mmol/L (ref 21–32)
GFR CALC AF AMER: 53 — AB
GFR CALC NON AF AMER: 45 — AB
Glucose: 157 mg/dL — ABNORMAL HIGH (ref 65–99)
Osmolality: 289 (ref 275–301)
Potassium: 4.2 mmol/L (ref 3.5–5.1)
Sodium: 143 mmol/L (ref 136–145)

## 2013-08-27 LAB — URINE CULTURE

## 2013-08-29 ENCOUNTER — Emergency Department: Payer: Self-pay | Admitting: Emergency Medicine

## 2013-08-29 LAB — ETHANOL: Ethanol %: <0.003 % (ref 0.000–0.080)

## 2013-08-29 LAB — ACETAMINOPHEN LEVEL: Acetaminophen: <2 ug/mL

## 2013-08-29 LAB — COMPREHENSIVE METABOLIC PANEL
Albumin: 4.1 g/dL (ref 3.4–5.0)
Alkaline Phosphatase: 75 U/L
Anion Gap: 9 (ref 7–16)
BILIRUBIN TOTAL: 0.6 mg/dL (ref 0.2–1.0)
BUN: 38 mg/dL — AB (ref 7–18)
CALCIUM: 9 mg/dL (ref 8.5–10.1)
Chloride: 109 mmol/L — ABNORMAL HIGH (ref 98–107)
Co2: 26 mmol/L (ref 21–32)
Creatinine: 1.6 mg/dL — ABNORMAL HIGH (ref 0.60–1.30)
EGFR (African American): 50 — ABNORMAL LOW
GFR CALC NON AF AMER: 43 — AB
Glucose: 97 mg/dL (ref 65–99)
OSMOLALITY: 296 (ref 275–301)
Potassium: 3.9 mmol/L (ref 3.5–5.1)
SGOT(AST): 23 U/L (ref 15–37)
SGPT (ALT): 17 U/L (ref 12–78)
SODIUM: 144 mmol/L (ref 136–145)
Total Protein: 8.1 g/dL (ref 6.4–8.2)

## 2013-08-29 LAB — CBC
HCT: 36.4 % — ABNORMAL LOW (ref 40.0–52.0)
HGB: 12.1 g/dL — ABNORMAL LOW (ref 13.0–18.0)
MCH: 33.9 pg (ref 26.0–34.0)
MCHC: 33.3 g/dL (ref 32.0–36.0)
MCV: 102 fL — ABNORMAL HIGH (ref 80–100)
PLATELETS: 214 10*3/uL (ref 150–440)
RBC: 3.58 10*6/uL — AB (ref 4.40–5.90)
RDW: 12.7 % (ref 11.5–14.5)
WBC: 7.5 10*3/uL (ref 3.8–10.6)

## 2013-08-29 LAB — SALICYLATE LEVEL: Salicylates, Serum: <1.7 mg/dL

## 2013-08-30 LAB — URINALYSIS, COMPLETE
Bacteria: NONE SEEN
Bilirubin,UR: NEGATIVE
Glucose,UR: NEGATIVE mg/dL (ref 0–75)
KETONE: NEGATIVE
NITRITE: NEGATIVE
PH: 6 (ref 4.5–8.0)
RBC,UR: 60 /HPF (ref 0–5)
Specific Gravity: 1.013 (ref 1.003–1.030)
Squamous Epithelial: NONE SEEN

## 2013-08-30 LAB — DRUG SCREEN, URINE

## 2013-09-19 ENCOUNTER — Emergency Department: Payer: Self-pay | Admitting: Emergency Medicine

## 2013-09-19 LAB — COMPREHENSIVE METABOLIC PANEL
Albumin: 4 g/dL (ref 3.4–5.0)
Alkaline Phosphatase: 76 U/L
Anion Gap: 7 (ref 7–16)
BILIRUBIN TOTAL: 0.5 mg/dL (ref 0.2–1.0)
BUN: 18 mg/dL (ref 7–18)
CHLORIDE: 108 mmol/L — AB (ref 98–107)
CO2: 28 mmol/L (ref 21–32)
CREATININE: 1.25 mg/dL (ref 0.60–1.30)
Calcium, Total: 8.6 mg/dL (ref 8.5–10.1)
EGFR (African American): >60
GFR CALC NON AF AMER: 58 — AB
Glucose: 87 mg/dL (ref 65–99)
Osmolality: 286 (ref 275–301)
Potassium: 3.9 mmol/L (ref 3.5–5.1)
SGOT(AST): 23 U/L (ref 15–37)
SGPT (ALT): 16 U/L
Sodium: 143 mmol/L (ref 136–145)
TOTAL PROTEIN: 7.8 g/dL (ref 6.4–8.2)

## 2013-09-19 LAB — ETHANOL: Ethanol %: <0.003 % (ref 0.000–0.080)

## 2013-09-19 LAB — CBC
HCT: 34.8 % — ABNORMAL LOW (ref 40.0–52.0)
HGB: 12 g/dL — AB (ref 13.0–18.0)
MCH: 33.7 pg (ref 26.0–34.0)
MCHC: 34.5 g/dL (ref 32.0–36.0)
MCV: 98 fL (ref 80–100)
Platelet: 169 10*3/uL (ref 150–440)
RBC: 3.57 10*6/uL — AB (ref 4.40–5.90)
RDW: 12.8 % (ref 11.5–14.5)
WBC: 7.9 10*3/uL (ref 3.8–10.6)

## 2013-09-19 LAB — ACETAMINOPHEN LEVEL

## 2013-09-19 LAB — SALICYLATE LEVEL: Salicylates, Serum: <1.7 mg/dL

## 2013-09-24 ENCOUNTER — Ambulatory Visit: Payer: Self-pay | Admitting: Urology

## 2013-09-30 ENCOUNTER — Ambulatory Visit: Payer: Self-pay | Admitting: Urology

## 2013-10-10 ENCOUNTER — Emergency Department: Payer: Self-pay | Admitting: Emergency Medicine

## 2013-10-10 LAB — COMPREHENSIVE METABOLIC PANEL
ANION GAP: 7 (ref 7–16)
Albumin: 4 g/dL (ref 3.4–5.0)
Alkaline Phosphatase: 71 U/L
BUN: 28 mg/dL — AB (ref 7–18)
Bilirubin,Total: 0.5 mg/dL (ref 0.2–1.0)
CALCIUM: 9.1 mg/dL (ref 8.5–10.1)
CHLORIDE: 108 mmol/L — AB (ref 98–107)
CO2: 31 mmol/L (ref 21–32)
Creatinine: 1.36 mg/dL — ABNORMAL HIGH (ref 0.60–1.30)
EGFR (Non-African Amer.): 52 — ABNORMAL LOW
GLUCOSE: 101 mg/dL — AB (ref 65–99)
Osmolality: 296 (ref 275–301)
POTASSIUM: 3.5 mmol/L (ref 3.5–5.1)
SGOT(AST): 21 U/L (ref 15–37)
SGPT (ALT): 16 U/L
Sodium: 146 mmol/L — ABNORMAL HIGH (ref 136–145)
Total Protein: 7.4 g/dL (ref 6.4–8.2)

## 2013-10-10 LAB — CBC
HCT: 34.8 % — AB (ref 40.0–52.0)
HGB: 11.3 g/dL — ABNORMAL LOW (ref 13.0–18.0)
MCH: 32.9 pg (ref 26.0–34.0)
MCHC: 32.6 g/dL (ref 32.0–36.0)
MCV: 101 fL — ABNORMAL HIGH (ref 80–100)
Platelet: 190 10*3/uL (ref 150–440)
RBC: 3.45 10*6/uL — ABNORMAL LOW (ref 4.40–5.90)
RDW: 13.7 % (ref 11.5–14.5)
WBC: 6 10*3/uL (ref 3.8–10.6)

## 2013-10-10 LAB — ETHANOL: Ethanol %: <0.003 % (ref 0.000–0.080)

## 2013-10-10 LAB — TROPONIN I: Troponin-I: <0.02 ng/mL

## 2013-10-11 ENCOUNTER — Emergency Department: Payer: Self-pay | Admitting: Emergency Medicine

## 2013-10-11 LAB — URINALYSIS, COMPLETE
BILIRUBIN, UR: NEGATIVE
BLOOD: NEGATIVE
Bacteria: NONE SEEN
Bacteria: NONE SEEN
Bilirubin,UR: NEGATIVE
Blood: NEGATIVE
GLUCOSE, UR: NEGATIVE mg/dL (ref 0–75)
Glucose,UR: NEGATIVE mg/dL (ref 0–75)
Ketone: NEGATIVE
Ketone: NEGATIVE
LEUKOCYTE ESTERASE: NEGATIVE
LEUKOCYTE ESTERASE: NEGATIVE
NITRITE: NEGATIVE
Nitrite: NEGATIVE
PH: 7 (ref 4.5–8.0)
PH: 5 (ref 4.5–8.0)
Protein: 30
Protein: NEGATIVE
RBC,UR: <1 /HPF (ref 0–5)
SQUAMOUS EPITHELIAL: NONE SEEN
Specific Gravity: 1.013 (ref 1.003–1.030)
Specific Gravity: 1.012 (ref 1.003–1.030)
Squamous Epithelial: <1
WBC UR: <1 /HPF (ref 0–5)

## 2013-10-11 LAB — COMPREHENSIVE METABOLIC PANEL
ALK PHOS: 68 U/L
AST: 18 U/L (ref 15–37)
Albumin: 3.9 g/dL (ref 3.4–5.0)
Anion Gap: 8 (ref 7–16)
BUN: 33 mg/dL — ABNORMAL HIGH (ref 7–18)
Bilirubin,Total: 0.4 mg/dL (ref 0.2–1.0)
CHLORIDE: 109 mmol/L — AB (ref 98–107)
CREATININE: 1.6 mg/dL — AB (ref 0.60–1.30)
Calcium, Total: 9 mg/dL (ref 8.5–10.1)
Co2: 28 mmol/L (ref 21–32)
GFR CALC AF AMER: 50 — AB
GFR CALC NON AF AMER: 43 — AB
GLUCOSE: 110 mg/dL — AB (ref 65–99)
OSMOLALITY: 297 (ref 275–301)
POTASSIUM: 3.5 mmol/L (ref 3.5–5.1)
SGPT (ALT): 11 U/L — ABNORMAL LOW
Sodium: 145 mmol/L (ref 136–145)
TOTAL PROTEIN: 7.2 g/dL (ref 6.4–8.2)

## 2013-10-11 LAB — CBC
HCT: 33.5 % — AB (ref 40.0–52.0)
HGB: 10.9 g/dL — ABNORMAL LOW (ref 13.0–18.0)
MCH: 32.8 pg (ref 26.0–34.0)
MCHC: 32.6 g/dL (ref 32.0–36.0)
MCV: 101 fL — ABNORMAL HIGH (ref 80–100)
PLATELETS: 157 10*3/uL (ref 150–440)
RBC: 3.33 10*6/uL — ABNORMAL LOW (ref 4.40–5.90)
RDW: 13.6 % (ref 11.5–14.5)
WBC: 4.1 10*3/uL (ref 3.8–10.6)

## 2013-10-11 LAB — DRUG SCREEN, URINE

## 2013-10-11 LAB — ETHANOL: Ethanol %: <0.003 % (ref 0.000–0.080)

## 2013-10-11 LAB — ACETAMINOPHEN LEVEL: Acetaminophen: <2 ug/mL

## 2013-10-11 LAB — SALICYLATE LEVEL: Salicylates, Serum: <1.7 mg/dL

## 2013-10-22 ENCOUNTER — Emergency Department: Payer: Self-pay | Admitting: Emergency Medicine

## 2013-10-22 LAB — COMPREHENSIVE METABOLIC PANEL
ALBUMIN: 4.2 g/dL (ref 3.4–5.0)
ALK PHOS: 70 U/L
ALT: 15 U/L
ANION GAP: 7 (ref 7–16)
BUN: 21 mg/dL — AB (ref 7–18)
Bilirubin,Total: 0.8 mg/dL (ref 0.2–1.0)
Calcium, Total: 8.9 mg/dL (ref 8.5–10.1)
Chloride: 105 mmol/L (ref 98–107)
Co2: 26 mmol/L (ref 21–32)
Creatinine: 1.54 mg/dL — ABNORMAL HIGH (ref 0.60–1.30)
GFR CALC AF AMER: 52 — AB
GFR CALC NON AF AMER: 45 — AB
Glucose: 220 mg/dL — ABNORMAL HIGH (ref 65–99)
Osmolality: 285 (ref 275–301)
Potassium: 3.3 mmol/L — ABNORMAL LOW (ref 3.5–5.1)
SGOT(AST): 16 U/L (ref 15–37)
Sodium: 138 mmol/L (ref 136–145)
Total Protein: 7.9 g/dL (ref 6.4–8.2)

## 2013-10-22 LAB — URINALYSIS, COMPLETE
BLOOD: NEGATIVE
Bacteria: NONE SEEN
Bilirubin,UR: NEGATIVE
Glucose,UR: 150 mg/dL (ref 0–75)
Ketone: NEGATIVE
Leukocyte Esterase: NEGATIVE
Nitrite: NEGATIVE
Ph: 6 (ref 4.5–8.0)
RBC,UR: <1 /HPF (ref 0–5)
Specific Gravity: 1.014 (ref 1.003–1.030)
Squamous Epithelial: <1

## 2013-10-22 LAB — DRUG SCREEN, URINE
Amphetamines, Ur Screen: NEGATIVE (ref ?–1000)
Barbiturates, Ur Screen: NEGATIVE (ref ?–200)
Benzodiazepine, Ur Scrn: NEGATIVE (ref ?–200)
CANNABINOID 50 NG, UR ~~LOC~~: NEGATIVE (ref ?–50)
COCAINE METABOLITE, UR ~~LOC~~: NEGATIVE (ref ?–300)
MDMA (Ecstasy)Ur Screen: NEGATIVE (ref ?–500)
Methadone, Ur Screen: NEGATIVE (ref ?–300)
Opiate, Ur Screen: NEGATIVE (ref ?–300)
Phencyclidine (PCP) Ur S: NEGATIVE (ref ?–25)
Tricyclic, Ur Screen: NEGATIVE (ref ?–1000)

## 2013-10-22 LAB — CBC
HCT: 36.9 % — ABNORMAL LOW (ref 40.0–52.0)
HGB: 12.1 g/dL — ABNORMAL LOW (ref 13.0–18.0)
MCH: 32.6 pg (ref 26.0–34.0)
MCHC: 32.8 g/dL (ref 32.0–36.0)
MCV: 100 fL (ref 80–100)
PLATELETS: 157 10*3/uL (ref 150–440)
RBC: 3.7 10*6/uL — ABNORMAL LOW (ref 4.40–5.90)
RDW: 13.3 % (ref 11.5–14.5)
WBC: 5.5 10*3/uL (ref 3.8–10.6)

## 2013-10-22 LAB — ACETAMINOPHEN LEVEL: Acetaminophen: <2 ug/mL

## 2013-10-22 LAB — SALICYLATE LEVEL

## 2013-10-22 LAB — ETHANOL: Ethanol: <3 mg/dL

## 2013-10-23 ENCOUNTER — Emergency Department: Payer: Self-pay | Admitting: Emergency Medicine

## 2013-10-23 LAB — DRUG SCREEN, URINE

## 2013-10-23 LAB — URINALYSIS, COMPLETE
BACTERIA: NONE SEEN
Bilirubin,UR: NEGATIVE
Blood: NEGATIVE
Glucose,UR: 150 mg/dL (ref 0–75)
Hyaline Cast: 1
LEUKOCYTE ESTERASE: NEGATIVE
Nitrite: NEGATIVE
PH: 6 (ref 4.5–8.0)
Specific Gravity: 1.017 (ref 1.003–1.030)

## 2013-10-23 LAB — CBC WITH DIFFERENTIAL/PLATELET
BASOS ABS: 0 10*3/uL (ref 0.0–0.1)
Basophil %: 0.5 %
EOS PCT: 0.6 %
Eosinophil #: 0 10*3/uL (ref 0.0–0.7)
HCT: 36.1 % — ABNORMAL LOW (ref 40.0–52.0)
HGB: 12 g/dL — ABNORMAL LOW (ref 13.0–18.0)
Lymphocyte #: 1.4 10*3/uL (ref 1.0–3.6)
Lymphocyte %: 25.8 %
MCH: 33 pg (ref 26.0–34.0)
MCHC: 33.2 g/dL (ref 32.0–36.0)
MCV: 99 fL (ref 80–100)
MONOS PCT: 9.9 %
Monocyte #: 0.5 x10 3/mm (ref 0.2–1.0)
Neutrophil #: 3.4 10*3/uL (ref 1.4–6.5)
Neutrophil %: 63.2 %
Platelet: 155 10*3/uL (ref 150–440)
RBC: 3.63 10*6/uL — ABNORMAL LOW (ref 4.40–5.90)
RDW: 13.1 % (ref 11.5–14.5)
WBC: 5.4 10*3/uL (ref 3.8–10.6)

## 2013-10-23 LAB — COMPREHENSIVE METABOLIC PANEL
ALBUMIN: 3.9 g/dL (ref 3.4–5.0)
Alkaline Phosphatase: 68 U/L
Anion Gap: 2 — ABNORMAL LOW (ref 7–16)
BILIRUBIN TOTAL: 0.8 mg/dL (ref 0.2–1.0)
BUN: 22 mg/dL — ABNORMAL HIGH (ref 7–18)
CHLORIDE: 108 mmol/L — AB (ref 98–107)
Calcium, Total: 8.6 mg/dL (ref 8.5–10.1)
Co2: 30 mmol/L (ref 21–32)
Creatinine: 1.31 mg/dL — ABNORMAL HIGH (ref 0.60–1.30)
EGFR (African American): >60
GFR CALC NON AF AMER: 55 — AB
GLUCOSE: 119 mg/dL — AB (ref 65–99)
Osmolality: 284 (ref 275–301)
Potassium: 3.3 mmol/L — ABNORMAL LOW (ref 3.5–5.1)
SGOT(AST): 22 U/L (ref 15–37)
SGPT (ALT): 15 U/L
SODIUM: 140 mmol/L (ref 136–145)
Total Protein: 7.6 g/dL (ref 6.4–8.2)

## 2013-10-23 LAB — TROPONIN I: Troponin-I: <0.02 ng/mL

## 2013-10-23 LAB — CK TOTAL AND CKMB (NOT AT ARMC)
CK, Total: 96 U/L
CK-MB: 1.5 ng/mL (ref 0.5–3.6)

## 2013-10-23 LAB — TSH: THYROID STIMULATING HORM: 1.13 u[IU]/mL

## 2013-10-30 ENCOUNTER — Emergency Department: Payer: Self-pay | Admitting: Student

## 2013-10-30 LAB — COMPREHENSIVE METABOLIC PANEL
Albumin: 3.8 g/dL (ref 3.4–5.0)
Alkaline Phosphatase: 58 U/L
Anion Gap: 5 — ABNORMAL LOW (ref 7–16)
BILIRUBIN TOTAL: 0.6 mg/dL (ref 0.2–1.0)
BUN: 22 mg/dL — ABNORMAL HIGH (ref 7–18)
CALCIUM: 8.8 mg/dL (ref 8.5–10.1)
Chloride: 107 mmol/L (ref 98–107)
Co2: 31 mmol/L (ref 21–32)
Creatinine: 1.38 mg/dL — ABNORMAL HIGH (ref 0.60–1.30)
GFR CALC AF AMER: 60 — AB
GFR CALC NON AF AMER: 51 — AB
Glucose: 95 mg/dL (ref 65–99)
OSMOLALITY: 288 (ref 275–301)
Potassium: 3.8 mmol/L (ref 3.5–5.1)
SGOT(AST): 22 U/L (ref 15–37)
SGPT (ALT): 13 U/L — ABNORMAL LOW
Sodium: 143 mmol/L (ref 136–145)
Total Protein: 7.2 g/dL (ref 6.4–8.2)

## 2013-10-30 LAB — CBC WITH DIFFERENTIAL/PLATELET
Basophil #: 0 10*3/uL (ref 0.0–0.1)
Basophil %: 0.6 %
Eosinophil #: 0.1 10*3/uL (ref 0.0–0.7)
Eosinophil %: 2 %
HCT: 34 % — AB (ref 40.0–52.0)
HGB: 11.1 g/dL — AB (ref 13.0–18.0)
LYMPHS PCT: 35.6 %
Lymphocyte #: 1.8 10*3/uL (ref 1.0–3.6)
MCH: 32.5 pg (ref 26.0–34.0)
MCHC: 32.6 g/dL (ref 32.0–36.0)
MCV: 99 fL (ref 80–100)
Monocyte #: 0.5 x10 3/mm (ref 0.2–1.0)
Monocyte %: 10 %
Neutrophil #: 2.6 10*3/uL (ref 1.4–6.5)
Neutrophil %: 51.8 %
Platelet: 153 10*3/uL (ref 150–440)
RBC: 3.42 10*6/uL — AB (ref 4.40–5.90)
RDW: 13.5 % (ref 11.5–14.5)
WBC: 5.1 10*3/uL (ref 3.8–10.6)

## 2013-10-30 LAB — ETHANOL: Ethanol: <3 mg/dL

## 2013-10-30 LAB — ACETAMINOPHEN LEVEL: Acetaminophen: 5 ug/mL — ABNORMAL LOW

## 2013-10-30 LAB — SALICYLATE LEVEL: Salicylates, Serum: <1.7 mg/dL

## 2013-10-31 LAB — URINALYSIS, COMPLETE
Bacteria: NEGATIVE
Bilirubin,UR: NEGATIVE
Blood: NEGATIVE
Glucose,UR: 50 mg/dL (ref 0–75)
Ketone: NEGATIVE
LEUKOCYTE ESTERASE: NEGATIVE
Nitrite: NEGATIVE
PH: 9 (ref 4.5–8.0)
PROTEIN: NEGATIVE
RBC,UR: NONE SEEN /HPF (ref 0–5)
Specific Gravity: 1.005 (ref 1.003–1.030)
Squamous Epithelial: NONE SEEN
WBC UR: NONE SEEN /HPF (ref 0–5)

## 2013-10-31 LAB — DRUG SCREEN, URINE

## 2014-06-10 NOTE — Consult Note (Signed)
Brief Consult Note: Diagnosis: bipolar disorder currently symptom free.   Patient was seen by consultant.   Consult note dictated.   Orders entered.   Comments: Psychiatry: Patient seen. Chart reviewed. Patient with a history of psychiatric illness maintained on a stable combination of meds. History of no recent mood or psychotic symptoms. Pt is alert and oriented and denie psychosis, SI or HI.   Stable. No indication for change in meds. Correct as far as already entered except will add cymbalta 30mg  qday - seems to be part of current regimin. Will check depakote level in the AM.  Electronic Signatures: Clapacs, Jackquline DenmarkJohn T (MD)  (Signed 05-Sep-13 17:04)  Authored: Brief Consult Note   Last Updated: 05-Sep-13 17:04 by Audery Amellapacs, John T (MD)

## 2014-06-10 NOTE — H&P (Signed)
PATIENT NAME:  Paul Dean, Paul Dean MR#:  [id number] DATE OF BIRTH:  1945/12/09  DATE OF ADMISSION:  10/27/2011  PRIMARY CARE PHYSICIAN: Einar Crow, MD.   CHIEF COMPLAINT: Altered mental status.   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 History is obtained from ED physician as well as from his wife. Apparently the patient was in his usual state of health until about a week ago when he saw his primary care provider, unclear what the presenting symptoms were, but was being treated in the outpatient setting with an antibiotic for prostatitis. On his medication reconciliation it is presumed that this might be ciprofloxacin. On the morning of presentation at about 2:00 a.m. patient woke up, was febrile, had chills, and then proceeded to the bathroom and had multiple episodes of emesis. This was witnessed by his wife. She is unable to describe if there was any blood in the emesis; however, due to ongoing symptoms EMS was alerted, and he presented to the emergency department. Here he is noted to be febrile, tachycardic, with leukocytosis; and chest x-ray shows right lower lobe infiltrate so he is being admitted for concern of sepsis as a result of likely prostatitis and pneumonia.   PAST MEDICAL HISTORY:  1. Hypertension.  2. Type 2 diabetes.  3. Major depression with severe psychiatric disease and bipolar disorder.  4. Chronic renal insufficiency.  5. Gastroesophageal reflux disease.  6. Chronic obstructive pulmonary disease, patient is not on maintenance oxygen therapy.  7. Sleep apnea, not tolerating BiPAP.  8. Hyperlipidemia.  9. History of Bell's palsy with residual facial weakness.  10. History of alcohol abuse.    PAST SURGICAL HISTORY: Notable for hernia repair in the past as reported by wife.    ALLERGIES: No known drug allergies.    HOME MEDICATIONS:  1. According to medication list provided with the patient by his wife, he takes 30 mg Cymbalta daily. Looks like Cipro 500 mg for 10 days. Looks like the Cymbalta was decreased in dose to daily due to concurrent ciprofloxacin use. The family does report that the dose was cut down.  2. Klor-Con 20 mEq daily.  3. Enalapril 20 mg daily at night.  4. Trihexyphenidyl  HCl 2 mg tablet 1 tablet by mouth at bedtime.  5. Mirtazapine 45 mg at bedtime.  6. Depakote ER 875 mg (individual tablets of 250 mg, 125 mg, and 500 mg). It seems that he takes Depakote 125 mg during the day and the remaining 750 at bedtime.   7. Trazodone 150 mg at bedtime.  8. Omeprazole 20 mg daily.  9. Abilify 15 mg as well as Abilify 2 mg with the direction 1/2 tablet by mouth each day so for a presumed total of 16 mg of Abilify daily  10. Advair Diskus 205/50 mg 1 puff inhaled twice a day.  11. Spiriva inhaled 1 puff daily  12. Simvastatin 80 mg daily. 13. Medication reconciliation with the Medical [facility] located on [address] [place], [place], telephone number [phone] would be recommended for the most up-to-date home medications.   FAMILY HISTORY: Negative for diabetes, hypertension, coronary artery disease.   SOCIAL HISTORY: Smokes cigarettes occasionally. Denies alcohol or illicit drug use.   REVIEW OF SYSTEMS: CONSTITUTIONAL: There has been report of fatigue and weakness. Patient does have residual left-sided weakness after his severe Bell's palsy a few years  ago. He did have chills and was febrile subjectively at home and in objectively in the emergency department. EYES: No blurred vision. No pain. No double vision. ENT: No tinnitus, earache, nasal discharge. RESPIRATORY: No cough, no shortness of breath. No chest pain. CARDIOVASCULAR: No chest pain. No palpitations. No orthopnea. GASTROINTESTINAL: There has been vomiting multiple episodes today  but no diarrhea, no history of abdominal pain, no rectal bleeding.  GU: Denies dysuria or difficulty with micturition. Denies hematuria. ENDOCRINE: Denies increased sweating.  INTEGUMENT: Denies any skin rashes. MUSCULOSKELETAL: Denies pain in joints or swelling in joints. NEURO: Denies numbness, dysarthria. PSYCH: Does have history of depression and psychiatric disorder, presumed bipolar disorder.   PHYSICAL EXAMINATION:   VITAL SIGNS: Temperature 101.1 rectal, heart rate 111, respirations 20, blood pressure 105/100, initially 91% on room air, increased to mid 90s on 2L nasal cannula.   GENERAL: Caucasian male, slightly depressed, but responds to questions, slow to respond though, somnolent, in no apparent distress.   EYES: Slight lid lag on the left. Pupils are equal, round, and reactive to light, anicteric sclerae.   ENT: Normal external ears and nares. Moist mucous membranes. Posterior oropharynx was clear without exudate.    NECK: Supple. There is no thyromegaly.   CARDIAC: Tachycardic. No murmurs appreciated. There is no pretibial edema, 2+ distal pulses intact.    RESPIRATORY: The chest is nontender. There is rhonchi particularly on the right lung base. There is normal effort.   ABDOMEN: There is diffuse abdominal tenderness. The abdomen is soft. There is tenderness in all 4 quadrants, particularly the suprapubic region, left upper quadrant, epigastrium, periumbilical area on deep palpation. There is no rebound. There was no guarding. There are normal bowel sounds. There is no hepatosplenomegaly appreciated.   RECTAL: Internal hemorrhoids are palpated. Prostate was smooth and apparently nontender.   MUSCULOSKELETAL: There is normal tone. There is no clubbing. There is no cyanosis.  There is normal strength in bilateral upper and lower extremities.   PSYCHIATRIC: Somnolent, oriented to person, place, and partially to time. Was able to tell me that it was September 2013 although he was  slow to respond. At this time unable to assess if he has appropriate insight and judgment given his underlying psychiatric illnesses and the acute illness.   SKIN: Mildly diaphoretic. Normal skin color. No areas of erythema or cellulitis. There are some hyperpigmented areas in the lower extremities that seemed chronic.   LABORATORY DATA: White blood cell count 12.9, hemoglobin 13.8, hematocrit 39.8, platelets 201,000, MCV 98, neutrophils 91%. Glucose is 136, BUN is 21, creatinine is 1.22, sodium is 142, potassium is 3.5, chloride is 108, bicarbonate is 27, calcium is 8.8, bilirubin is 0.5. Alkaline phosphatase is 75, ALT is 12 AST is 11, total protein is 7.5, albumin is 3.7. Osmolality is 288, anion gap is 7. INR is 1. Troponin is less than 0.02. Urinalysis shows 50 mcg of glucose, pH of 7, negative nitrites, negative leukocyte esterase. There is 1 RBC and less than 1 white blood cell. Lipase is pending.    EKG shows normal sinus rhythm at 98 beats per minute with T-wave inversions in leads III and aVF.    Chest x-ray shows cardiomegaly with interstitial edema and right lung base atelectasis verus infiltrate.    CT head without contrast shows no acute intracranial process. There is no evidence of mass effect, midline shift, or extraaxial fluid collections. There are no space-occupying lesions nor intracranial hemorrhage.   ASSESSMENT AND PLAN: A 69 year old  gentleman, history of psychiatric illness, bipolar disorder, recurrent prostatitis, recent treatment for acute prostatitis presenting with fever, tachycardia, and leukocytosis concerning for sepsis as a result of possible prostatitis and then complicated by likely aspiration pneumonia.  1. Sepsis from either prostatitis or pneumonia. At this time he has received clindamycin and levofloxacin in the emergency department. We will continue this at this time. Blood cultures are sent and pending. Based on data we will adjust his antibiotics as needed. We  will check CBC daily.   2. Prostatitis. Recent outpatient treatment for prostatitis, at this time concern for maybe prostate abscess or bacteremia as a result of the prostatitis. We will continue antibiotics and check CT abdomen and pelvis to rule out abscess.  3. Diabetes mellitus, not on any outpatient regimen. We will  place him on a diabetic diet and check hemoglobin A1c. Last A1c was 6 in 2011.  4. Gastroesophageal reflux disease.  We will continue his omeprazole.  5. Bipolar disorder. We will continue his Depakote, Abilify, and trihexyphenidyl. Depression, stable.  We will also continue Cipro daily and mirtazapine.   6. Hypertension, stable. We will continue his enalapril.  7. Hyperlipidemia. We will continue his simvastatin.  8. Chronic obstructive pulmonary disease. Continue his Spiriva and Advair.  9. Again medication reconciliation from the pharmacy is recommended as this current home list does not include Spiriva or Advair on it.  10. Prophylaxis with Lovenox.   CODE STATUS: FULL.   DISPOSITION: The patient is being admitted to the medical floor under Dr. Alva GarnetMarshall Anderson's service. This was discussed with Dr. Graciela HusbandsKlein.   TIME SPENT ON ADMISSION: 60 minutes.      ____________________________ Aurther LoftAdaorah E. Edgar Corrigan, DO aeo:vtd D: 10/27/2011 08:42:57 ET T: 10/27/2011 10:02:19 ET JOB#: [id number]  cc: Aurther LoftAdaorah E. Davin Archuletta, DO, <Dictator> Marya AmslerMarshall W. Dareen PianoAnderson, MD Clay Solum E Joshuajames Moehring DO ELECTRONICALLY SIGNED 11/09/2011 0:30

## 2014-06-10 NOTE — Discharge Summary (Signed)
PATIENT NAME:  Paul Dean, Paul F MR#:  [id number] DATE OF BIRTH:  April 22, 1945  DATE OF ADMISSION:  10/27/2011 DATE OF DISCHARGE:  11/03/2011   DISCHARGE DIAGNOSES:  1. Sepsis.  2. Prostatitis  3. Severe psychiatric disease.  4. Encephalopathy secondary to all the above.   DISCHARGE MEDICATIONS:  1. Tylenol 650 q.4 hours p.r.n. pain.  2. Abilify 15 mg daily.  3. Depakote 875 mg at night.  4. Docusate 100 mg b.i.d. p.r.n. constipation.  5. Cymbalta 30 mg p.o. daily.  6. Enalapril 20 mg p.o. daily.  7. Lovenox 40 mg sub-Q daily for two weeks. 8. Advair 250/50 one puff b.i.d.  9. Mirtazapine 45 mg at bedtime.  10. Omeprazole 20 mg daily.  11. Potassium chloride 20 mEq p.o. b.i.d.  12. Senna one p.o. b.i.d. p.r.n. constipation.  13. Tiotropium, which is Spiriva, one capsule daily.  14. Trazodone 150 mg p.o. at bedtime. 15. Artane 2 mg daily at 9 o'clock. 16. Atorvastatin 40 mg p.o. daily.  17. Levaquin 500 mg p.o. daily for seven days after discharge. 18. Torsemide 5 mg p.o. daily and monitor him for fluid overload and signs of dehydration.    HISTORY AND PHYSICAL: Please see detailed history and physical done on admission.   HOSPITAL COURSE: The patient was admitted with worsening confusion, sepsis syndrome with tachycardia, confusion, blood pressure dropping, etc. He was kept on Levaquin. There was concern about possible pneumonia on admission. His chest x-rays seemingly are more consistent with atelectasis. No growth was found in his blood cultures. His urinalysis was improved though it was abnormal with signs of prostatitis when done as outpatient prior to this admission. CT abdomen and pelvis was done with concerns of prostatic abscess but that was not seen nor other abnormalities noted. White count of 12,900 was present on admission and improved to normal at 6000 by September 9th. Chest x-ray, PA and lateral, on the 8th showed no alveolar pneumonia per report.   He is agreeing and has  a bed offer at skilled nursing facility. He will be ready to go when/if [place] [facility] [place] [facility] [place] [facility] [place].   ____________________________ Marya AmslerMarshall W. Dareen PianoAnderson, MD mwa:drc D: 11/03/2011 07:41:26 ET T: 11/03/2011 08:13:02 ET JOB#: [id number]  cc: Marya AmslerMarshall W. Dareen PianoAnderson, MD, <Dictator> Lauro RegulusMARSHALL W ANDERSON MD ELECTRONICALLY SIGNED 11/03/2011 8:45

## 2014-06-10 NOTE — Consult Note (Signed)
Details:    - Psychiatry: Came by to see patient. He was alert and awake. Made good eye contact and interacted appropriately. Denied any mood or psychiatric complaints. He is oriented and has a good understanding of his hospitalization. No med SE. No SI or indication of dangerousness.  We reviewed his medications and outpt treatment. No indication for any change to treatment plan.   Electronic Signatures: Audery Amellapacs, Miarose Lippert T (MD)  (Signed 06-Sep-13 13:57)  Authored: Details   Last Updated: 06-Sep-13 13:57 by Audery Amellapacs, Kingston Guiles T (MD)

## 2014-06-10 NOTE — H&P (Signed)
PATIENT NAME:  Paul Dean, Paul Dean MR#:  [id number] DATE OF BIRTH:  12-04-1945  DATE OF ADMISSION:  11/29/2011  CHIEF COMPLAINT: Fever and vomiting.   HISTORY OF PRESENT ILLNESS: The patient is a 69 year old gentleman with recent history of prostatitis, sepsis, discharged in mid-September from [facility] to [facility]. He was discharged from the rehab facility yesterday and this morning early in the morning had a nausea and vomiting episode x1 with now persistent nausea. He also has had foul-smelling urine and increasing fevers. He has no appetite. Minimal abdominal pain. He has been coughing more over the last 24 hours and complains of a sore throat as well. He has also noticed some dysuria. No diarrhea, constipation, or frequency. With his earlier admission he had altered mental status, confusion. This illness currently does not seem to indicate any changes in his mental status.   PAST MEDICAL HISTORY:  1. Diabetes.  2. Hypertension.  3. Severe psychiatric disease.  4. Chronic renal insufficiency.  5. Diabetic neuropathy.  6. Gastroesophageal reflux disease.  7. Hypercholesterolemia.  8. Bell's palsy.  9. Pernicious anemia  10. Sepsis September 2013 with altered mental status and prostatitis. 11. History of alcohol abuse and tobacco abuse.  12. History of chronic obstructive pulmonary disease.  13. Sleep apnea, not tolerating BiPAP.   PAST SURGICAL HISTORY: Hernia repair.   ALLERGIES: No known drug allergies.   CURRENT MEDICATIONS:  1. Tylenol 650 mg q.4 hours p.r.n.  2. Abilify 15 mg daily.  3. Depakote 875 mg at night.  4. Docusate sodium 100 mg b.i.d. p.r.n.  5. Cymbalta 30 mg daily.  6. Enalapril 20 mg daily.  7. Advair 250/50 one puff b.i.d.  8. Mirtazapine 45 mg at bedtime.  9. Omeprazole 20 mg daily.  10. Potassium 20 mEq 2 times a day.  11. Senna one twice a day p.r.n.  12. Spiriva 1 capsule daily.  13. Trazodone 150 mg at bedtime.  14. Artane 2 mg at 9 a.m.   15. Atorvastatin 40 mg daily.  16. Torsemide 5 mg daily   FAMILY HISTORY: Negative for heart disease or diabetes.   SOCIAL HISTORY: Does have history of tobacco abuse and alcohol abuse. He is disabled.  REVIEW OF SYSTEMS: As per history of present illness, he does have chronic left-sided facial weakness from Bell's palsy several years ago. He denies any complaints of ear pain or hearing changes. He denies any chest pain or shortness of breath. He denies any skin changes. Otherwise, as per history of present illness.   PHYSICAL EXAMINATION:   GENERAL: Elderly male appearing weak.   VITAL SIGNS: Weight 192, blood pressure 110/80 sitting and 100/70 standing, temperature 100.7, pulse 110, oxygen sat 93% on room air.   HEENT: Head is normocephalic, atraumatic. Facial drooping evident with left-sided facial weakness.   EYES: Pupils equal, round, reactive to light. EOMS are intact. Sclerae anicteric. Conjunctivae are noninjected. His oropharynx is clear with no lesions or redness.   EARS: His ears show cerumen occlusions, partial.   NECK: Supple with no adenopathy.   HEART: Rapid rate in the low 100's. No murmurs, rubs, or gallops.   LUNGS: Bibasilar rales.  ABDOMEN: Diminished bowel sounds. Nontender. No guarding or rebound appreciated.    SKIN: Warm and dry.   EXTREMITIES: No edema.   ASSESSMENT AND PLAN:  1. Fevers, weakness, history of recurrent urinary tract infection, prostatitis, recent hospitalization in September 2013. Just discharged from rehab yesterday. His condition shows weakness and difficulty with ambulation and high  risk for falls. He is admitted to Observation. Start on IV antibiotics. Will obtain a CBC, MET-C, urinalysis with culture, as well as chest x-ray. Start Rocephin 1 gram IV q.24 hours.  2. Hypertension. Will hold enalapril and torsemide. He is hypotensive and start IV fluids at normal saline at 75 an hour. Re-evaluate in the morning and after labs.   3. Chronic obstructive pulmonary disease. Continue Advair and Spiriva. Obtain chest x-ray. Evaluate for pneumonia. IV antibiotics. Rocephin as per #1.  4. Chronic renal insufficiency. Monitor his renal function.  5. Fall, right shoulder pain. Obtain right shoulder films. Consider Orthopedic consult.   ____________________________ Marily Lente. Ryleigh Buenger, PA-C rjt:drc D: 11/29/2011 17:20:23 ET T: 11/29/2011 17:33:45 ET JOB#: [id number]  cc: Marily Lente. Lolita Lenz, <Dictator> Leonard Downing PA ELECTRONICALLY SIGNED 11/30/2011 15:58

## 2014-06-10 NOTE — Consult Note (Signed)
PATIENT NAME:  Paul Dean, SHAMOON MR#:  [id number] DATE OF BIRTH:  1945-09-03  DATE OF CONSULTATION:  10/27/2011  REFERRING PHYSICIAN:   CONSULTING PHYSICIAN:  Audery Amel, MD  IDENTIFYING INFORMATION AND REASON FOR CONSULTATION: This is a 69 year old man with a past history of bipolar disorder who was admitted to the hospital with acute nausea and vomiting, weakness, fever and chills. Consult was for delirium with severe mental illness.   HISTORY OF PRESENT ILLNESS: Information obtained from the patient and from the chart. The patient tells me that he had been pretty much in his usual state of health until the day prior to admission. He was feeling a little bit sick the night of admission. He woke up in the middle of the night feeling very sick. He went to the bathroom and vomited several times. His wife woke up and was alarmed at his appearance enough to bring him to the hospital. The patient tells me that he is still feeling weak and tired right now but is not feeling nauseous right now. As far as his mental state, he tells me that recently his mood has been pretty good. He denies any symptoms of depression. He says he has not been feeling sad or down. He has been basically enjoying his life spending time at home with his wife. He says that up until last night he had been sleeping fine with no complaints. Appetite had been good. Denies any recent symptoms of auditory or visual hallucinations. Totally denies any suicidal or homicidal ideation. He tells me that his wife usually takes care of all of his medications. He is not certain whether there have been many changes in his medicine recently but he does not think that there have been any major changes. His main psychiatric medication levels seem to have been stable for quite some time.   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 Earlier in his life he had multiple  hospitalizations. His most recent hospitalization was in 2010 with an episode of agitated depression. Since then he has apparently remained under pretty good symptom control. He does have a history of having threatened suicide and having psychotic symptoms in the past. He is currently on a combination of Depakote along with Abilify, Remeron, trazodone, as well as Cymbalta for his psychiatric condition. It looks like he was in the hospital in the spring of this year also for an episode of medical illness, possibly a pneumonia. At that time Dr. Maryruth Bun saw him in consult and found him to be stable.   SOCIAL HISTORY: The patient lives with his wife. He says that his home life is pretty calm now. There is no longer the conflict there used to be with some of his grandchildren or step-grandchildren. He says he and his wife get along well. He spends most of his time puttering around the house or watching television.   MEDICAL HISTORY: The patient has a history of high blood pressure, COPD, and elevated lipids. He had an admission to the hospital in the spring for what sounds like a somewhat similar presentation that may have turned out to be a pneumonia.   REVIEW OF SYSTEMS: The patient complains of feeling tired. Denies feeling depressed. Denies any hallucinations or paranoia. Totally denies any suicidal ideation. He is still feeling a little bit short of breath and a little bit sick to his stomach but is not actively throwing up.   CURRENT MEDICATIONS: At  the time of admission current medicines include: 1. Potassium 20 mEq per day.  2. Enalapril 20 mg per day.  3. Trihexyphenidyl 2 mg at night.  4. Remeron 40 mg at night.  5. Depakote extended-release 875 mg at night.  6. Trazodone 150 mg at night.  7. Omeprazole 20 mg per day.  8. Abilify 15 mg per day or possibly 16. It is not clear from the chart.  9. Advair Diskus 1 puff twice a day. 10. Spiriva 1 puff per day. 11. Simvastatin 80 mg per day.   12. Also, it looks like his Cymbalta might have recently been decreased from his previous 60 down to 30 possibly because he had recently been prescribed ciprofloxacin.   MENTAL STATUS EXAMINATION: Somewhat disheveled sick looking man, alert and awake in a hospital bed. Cooperative with the interview. Made good eye contact. Psychomotor activity limited. Speech was quiet, limited apparently by illness but otherwise normal in tone. Affect was constricted. Mood was stated as okay. Thoughts were generally lucid, a little bit simple, nothing bizarre. Able to hold an organized conversation without veering off topic. Denied suicidal or homicidal ideation. Denied any hallucinations or paranoia. The patient was alert and oriented x4 and able to explain his recent circumstances well. Baseline intelligence average to low average probably. Current memory function appears to be grossly intact. Insight and judgment appear okay.   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 He has been stable for the last several years without any recurrence of major episodes and has been stable on his current doses of medicine for quite some time. Right now he has no psychiatric symptoms. He has no symptoms of depression. No psychosis. No delirium. At this point it would seem the best course would be to continue his medications as usually prescribed.   TREATMENT PLAN:  1. Continue all of his medicines as usually prescribed. Everything was started on admission anyway except for the Cymbalta. I am going to add 30 mg of that back.  2. I will put in an order to check a Depakote level tomorrow morning.  3. Will follow-up in the hospital if needed.   DIAGNOSES PRINCIPLE AND PRIMARY:  AXIS I: Bipolar disorder type II, currently in remission.   SECONDARY DIAGNOSES:  AXIS I: No further diagnosis.   AXIS II: Borderline intellectual functioning by history.   AXIS III:   1. Acute febrile illness of unclear type so far. 2. History of chronic hypertension. 3. Type II diabetes. 4. Renal insufficiency. 5. GI reflux. 6. Chronic obstructive pulmonary disease. 7. Hyperlipidemia. 8. Bell's palsy with weakness of his right face.   AXIS IV: Moderate, chronic stress just from burden of multiple illnesses.   AXIS V: Functioning at time of evaluation 55.   ____________________________ Audery AmelJohn T. Kayte Borchard, MD jtc:drc D: 10/27/2011 22:49:28 ET T: 10/28/2011 09:52:26 ET JOB#: [id number]  cc: Audery AmelJohn T. Sandford Diop, MD, <Dictator> Audery AmelJOHN T Jeanenne Licea MD ELECTRONICALLY SIGNED 10/28/2011 12:34

## 2014-06-11 ENCOUNTER — Emergency Department
Admit: 2014-06-11 | Disposition: A | Discharge status: Other Acute Inpatient Hospital | Payer: Self-pay | Admitting: Emergency Medicine

## 2014-06-11 LAB — URINALYSIS, COMPLETE
Bacteria: NONE SEEN
Bilirubin,UR: NEGATIVE
Blood: NEGATIVE
Glucose,UR: NEGATIVE mg/dL (ref 0–75)
KETONE: NEGATIVE
LEUKOCYTE ESTERASE: NEGATIVE
NITRITE: NEGATIVE
PH: 5 (ref 4.5–8.0)
PROTEIN: NEGATIVE
RBC,UR: NONE SEEN /HPF (ref 0–5)
SPECIFIC GRAVITY: 1.011 (ref 1.003–1.030)

## 2014-06-11 LAB — COMPREHENSIVE METABOLIC PANEL
ANION GAP: 8 (ref 7–16)
Albumin: 4.5 g/dL
Alkaline Phosphatase: 82 U/L
BILIRUBIN TOTAL: 0.7 mg/dL
BUN: 25 mg/dL — ABNORMAL HIGH
CALCIUM: 9.3 mg/dL
CO2: 27 mmol/L
CREATININE: 1.34 mg/dL — AB
Chloride: 106 mmol/L
GFR CALC NON AF AMER: 53 — AB
GLUCOSE: 124 mg/dL — AB
POTASSIUM: 3.8 mmol/L
SGOT(AST): 17 U/L
SGPT (ALT): 12 U/L — ABNORMAL LOW
Sodium: 141 mmol/L
Total Protein: 7.7 g/dL

## 2014-06-11 LAB — CBC
HCT: 35 % — AB (ref 40.0–52.0)
HGB: 11.5 g/dL — ABNORMAL LOW (ref 13.0–18.0)
MCH: 32 pg (ref 26.0–34.0)
MCHC: 32.8 g/dL (ref 32.0–36.0)
MCV: 98 fL (ref 80–100)
PLATELETS: 228 10*3/uL (ref 150–440)
RBC: 3.58 10*6/uL — ABNORMAL LOW (ref 4.40–5.90)
RDW: 13.6 % (ref 11.5–14.5)
WBC: 6.5 10*3/uL (ref 3.8–10.6)

## 2014-06-11 LAB — DRUG SCREEN, URINE
Amphetamines, Ur Screen: NEGATIVE
Barbiturates, Ur Screen: NEGATIVE
Benzodiazepine, Ur Scrn: NEGATIVE
Cannabinoid 50 Ng, Ur ~~LOC~~: NEGATIVE
Cocaine Metabolite,Ur ~~LOC~~: NEGATIVE
MDMA (ECSTASY) UR SCREEN: NEGATIVE
Methadone, Ur Screen: NEGATIVE
Opiate, Ur Screen: NEGATIVE
Phencyclidine (PCP) Ur S: NEGATIVE
Tricyclic, Ur Screen: NEGATIVE

## 2014-06-11 LAB — ETHANOL

## 2014-06-11 LAB — ACETAMINOPHEN LEVEL

## 2014-06-11 LAB — SALICYLATE LEVEL

## 2014-06-13 NOTE — Consult Note (Signed)
PATIENT NAME:  Paul Dean, Paul Dean MR#:  [id number] DATE OF BIRTH:  28-Apr-1945  DATE OF CONSULTATION:  10/16/2012  REFERRING PHYSICIAN:  Janalyn Harder, MD CONSULTING PHYSICIAN:  Ardeen Fillers. Garnetta Buddy, MD  REASON FOR CONSULTATION: "I am suicidal."  HISTORY OF PRESENT ILLNESS: The patient is a 69 year old married Caucasian male who presented to the ED through his family members. The patient reported that he has been having suicidal thoughts and has a plan to walk out in front of the traffic. The patient's family members were concerned about his behavior. He told his wife and his daughter that he has been feeling suicidal and has been having family issues. He was upset with his grandson as he thinks that he has been using him. The patient reported that he takes his car and uses up all the gas and the patient has been making all the payments. The patient reported that he is really upset. The patient during my interview was noted to be sitting in the chair and was very upset and agitated and reported that he does not want to live anymore and he wants to die. He reported that he has been living with his wife and his daughter and is feeling angry at this time and does not want to go back home. He has recently also switched his psychiatrist and has started seeing Dr. Rogers Blocker at [facility] who has recently switched his medications. His family members noted that since the change in the medication he has started acting irritable and upset all the time. It has been getting worse for the past 3 days. His mood changes very quickly and he is upset all the time. The patient was unable to contract for safety.   PAST PSYCHIATRIC HISTORY: The patient has long history of bipolar disorder and has history of multiple hospitalizations in the past. His past hospitalization was in 2010 with an episode of agitated depression. After that he remained pretty stable with his continued psychotropic medication and his wife helps him with taking  medications. He did well on the combination of Depakote, Abilify, Remeron, Cymbalta and trazodone. He was recently hospitalized in September 2013 due to urinary tract infection. He was seen by Dr. Toni Amend at that time.   PAST MEDICAL HISTORY:  1.  High blood pressure and it was elevated during this admission. 2.  COPD. 3.  Hypercholesterolemia.   SOCIAL HISTORY: The patient currently lives with his wife, daughter and son-in-law. His home life is pretty stable and he does not have any acute issues. However, it seems like that the patient is currently having some issues with his grandchildren and step-grandchildren. He gets along well with his wife. He spends most of the time around the house doing some housework or watching television.   REVIEW OF SYSTEMS:  CONSTITUTIONAL: The patient currently denies any fever or chills. No weight changes.  EYES: No double or blurred vision.  RESPIRATORY: No shortness of breath or cough.  CARDIOVASCULAR: Has elevated blood pressure.  GASTROINTESTINAL: Denies any abdominal pain, nausea, vomiting or diarrhea.  GENITOURINARY: No incontinence or frequency.  ENDOCRINE: No heat or cold intolerance.  LYMPHATIC: No anemia or easy bruising.  INTEGUMENTARY: No acne or rash.   CURRENT HOME MEDICATIONS: 1.  Simvastatin 40 mg daily. 2.  Quetiapine 25 mg 3 times per day.  3.  Klor-Con 20 mEq 1 tab 2 times daily.  4.  Omeprazole 20 mg once a day. 5.  Duloxetine 60 mg once a day. 6.  Mirtazapine 30 mg  at bedtime. 7.  Trazodone 150 mg at bedtime. 8.  Enalapril 20 mg daily. 9.  Tamsulosin 0.4 mg at bedtime.  ALLERGIES: No known drug allergies.   VITAL SIGNS: Temperature 98.2, pulse 85, respirations 18, blood pressure 144/84.   LABORATORY DATA: Glucose 104, BUN 25, creatinine 1.16, sodium 142, potassium 3.6, chloride 108, bicarbonate 29, anion gap 5, osmolality 288, calcium 8.8. Blood alcohol level less than 3. Protein 7.0, albumin 3.7, bilirubin 0.1, AST 24, ALT 19.  Troponin less than 0.02. UDS was negative. WBC 5.0, RBC 3.4, hemoglobin 11.9, hematocrit 33.8, platelet count 186, MCV 98, MCH 34.7, RDW 12.9.   MENTAL STATUS EXAMINATION: The patient is a moderately built male who appears somewhat disheveled and was sitting in the chair. He was cooperative with the interview. He maintained poor eye contact. Psychomotor activity was low and somewhat retarded. His speech was low in tone and volume. Mood was fine. Affect was angry. Thought process was non-bizarre and simple. Thought content was non-delusional. He was having suicidal ideations at this time and was unable to contract for safety. He denied having any perceptual disturbances. He demonstrated poor insight and judgment.   DIAGNOSTIC IMPRESSION: AXIS I: Bipolar disorder, most recent episode mixed, moderate. AXIS II: None.  AXIS III: 1.  Chronic hypertension. 2.  Type 2 diabetes, diet controlled. 3.  Renal insufficiency by history. 4.  Chronic obstructive pulmonary disease. 5.  Gastroesophageal reflux by history.  6.  Hyperlipidemia. 7.  Bell's palsy with weakness of his right face.  AXIS IV: Severe.  AXIS V: Current global assessment of functioning 25.  TREATMENT PLAN: The patient will be admitted to the inpatient behavioral health unit for stabilization and safety.  I will adjust his medications as follows:  1.  Abilify 5 mg p.o. daily.  2.  Clonazepam 0.5 mg p.o. at bedtime.  3.  Depakote 250 mg at bedtime.  4.  Duloxetine 30 mg in the morning.  5.  Mirtazapine 15 mg at bedtime.  6.  Trazodone 150 mg at bedtime on a p.r.n. basis.   Treatment team to follow. The patient will be monitored closely and his medications will be adjusted according to his needs. T  Thank you for allowing me to participate in the care of this patient.  ____________________________ Ardeen FillersUzma S. Garnetta BuddyFaheem, MD usf:sb D: 10/16/2012 13:08:44 ET T: 10/16/2012 13:49:19 ET JOB#: [id number]  cc: Ardeen FillersUzma S. Garnetta BuddyFaheem, MD, <Dictator> Rhunette CroftUZMA  S Brianne Maina MD ELECTRONICALLY SIGNED 10/25/2012 13:55

## 2014-06-13 NOTE — Discharge Summary (Signed)
PATIENT NAME:  Paul Dean, [name] MR#:  [id number] DATE OF BIRTH:  [birth year]  HOSPITAL COURSE: See dictated history and physical for details of admission. A 69 year old male with a history of recurrent psychotic depressions versus bipolar disorder who was admitted to the hospital with worsening agitation, hostile behavior, confusion at home.   In the hospital initially, he was agitated verbally, frequently confused, appeared to be having psychotic symptoms. He was endorsing depression and intermittently endorsing suicidal ideation. The patient was treated with medication and supportive and educational therapy in the hospital. His medications were based on prior effective medications for him and were gradually titrated to what he could tolerate and what seemed to be helpful for stabilizing his mood and helping with his irritability.   For many days, he continued to be up and down with his mood. Additionally, the patient has chronic medical problems and frequently was unable to ambulate without a wheelchair. His blood sugars were up and down and his blood pressure tends to run very high. Physical therapy consult was obtained and was helpful in working with him on improving his ambulation. Internal medicine consulted assisted with his chronic edema in his leg and persistent hypertension and  multiple medical problems.   The patient seemed to be improving quite a bit and we were working toward a discharge home when the day prior to discharge, he became acutely much more medically ill than he had seemed  previously. He became very weak and uncommunicative. Could not get out of a chair. Was not able to answer questions well. Just looked much more sick. Had low oxygen at one point. Respiratory therapy was called. Eventually the patient was transferred to the internal medicine service for further medical stabilization. He was on his stable psychiatric medicines at the time of transfer.   DISCHARGE MEDICATIONS:  Depakote 875 mg at night, Vasotec 20 mg per day, mirtazapine 45 mg at night, Prilosec 20 mg in the morning, potassium chloride 20 mEq twice a day, Zocor 40 mg at night, Flomax 0.4 mg per day, trazodone 50 mg at night as needed for sleep, Ativan 1 mg q.4 hours p.r.n. for agitation, nicotine patch 21 mg per day, Ambien 5 mg at night, duloxetine 30 mg per day, Zyprexa 15 mg at night, sliding scale insulin, pantoprazole 40 mg in the morning.   LABORATORY RESULTS: Admission labs included an EKG that showed normal sinus rhythm and was a normal EKG.   Chemistry panel: Creatinine slightly high at 1.1, BUN elevated at 25. Anemia with a hemoglobin of 11.9. Alcohol undetected. Acetaminophen undetected. PT and PTT normal. Liver panel normal. Urinalysis unremarkable. Drug screen negative. B-natriuretic peptide done on August 27 elevated at 237.   Hemoglobin A1c normal at 6.0.   Doppler of the right leg showed no evidence of DVT.   Echo Doppler on the 28th showed impaired relaxation panel of the left ventricular diastolic filling phase; otherwise, unremarkable.   A blood gas done prior to transfer showed a pO2 low at 73 at one point.   Chest x-ray: No significant acute findings.   MENTAL STATUS EXAMINATION AT DISCHARGE: At the time of discharge from behavioral health, this was a disheveled gentleman who looks older than his stated age. Passive and unable to cooperate with the exam. Poor eye contact. Psychomotor activity almost nonexistent. Speech is slurred and almost nonexistent. Affect flat. Mood unstated. Thoughts minimal and confused. No evidence of responding to internal stimuli or delusions. Denies suicidal or homicidal ideation. Not showing any  aggression or self injury. Cognitive function clearly impaired. Judgment and insight poor.   DISPOSITION: He was transferred to the medical service for further treatment and rule out of MI.   DIAGNOSIS, PRINCIPAL AND PRIMARY:  AXIS I: Bipolar disorder, not  otherwise specified.   SECONDARY DIAGNOSES:  AXIS I: No further diagnosis.  AXIS II: Deferred.  AXIS III: Hypertension, chronic edema, heart failure, gastric reflux disease, prostate swelling.  AXIS IV: Moderate to severe from his illness.  AXIS V: Functioning at time of discharge, 40.   ____________________________ Audery Amel, MD jtc:np D: 11/12/2012 17:04:49 ET T: 11/12/2012 20:37:48 ET JOB#: [id number]  cc: Audery Amel, MD, <Dictator> Audery Amel MD ELECTRONICALLY SIGNED 11/12/2012 22:44

## 2014-06-13 NOTE — H&P (Signed)
PATIENT NAME:  Bonnita HollowSIMPSON, Alric F MR#:  [id number] DATE OF BIRTH:  1945-05-10   This is an H and P for patient Ginny ForthSimpson, Haruki, as we are transferring him from behavioral unit to the medical floor. Please see  my consult note dictated today when I saw the patient for medical consult and then decided to transfer him to medical floor for further management, as psychiatry floor is not equipped with proper monitoring of this type of patient, so we will transfer the patient to medical floor. My history and physical and all assessment and everything remains the same as my consult notes.   Please attach that with history and physical.   ____________________________ Hope PigeonVaibhavkumar G. Elisabeth PigeonVachhani, MD vgv:np D: 10/25/2012 18:03:38 ET T: 10/25/2012 19:46:33 ET JOB#: [id number]  cc: Hope PigeonVaibhavkumar G. Elisabeth PigeonVachhani, MD, <Dictator> Altamese DillingVAIBHAVKUMAR Jacoby Zanni MD ELECTRONICALLY SIGNED 10/27/2012 15:43

## 2014-06-13 NOTE — Discharge Summary (Signed)
PATIENT NAME:  Paul Dean, Paul Dean MR#:  [id number] DATE OF BIRTH:  11/29/1945  DATE OF ADMISSION:  01/11/2013 DATE OF DISCHARGE:  01/15/2013  DISCHARGE DIAGNOSES: 1. Acute renal failure secondary to dehydration and rhabdomyolysis.  2. Rhabdomyolysis secondary to fall.  3. Fall and ataxia.  4. Hypertension with history of cardiomyopathy.  5. Severe depression with history of psychosis, now merely depressed, status post recent psychiatric admission less than a week ago.   DISCHARGE [facility]ICATIONS[facility] Please see for details. Also needs thigh high TED hose on before getting up and around to help with possible orthostasis to reduce ataxia.   HISTORY AND PHYSICAL: Please see detailed history and physical done on admission.   HOSPITAL COURSE: The patient was admitted after a fall lying on the floor at a group care home, unclear how long, but did have marked excoriations and some bruising. Had elevated creatinine kinase levels to 6700 on admission which did come down to normal.   CK-MB was not abnormal. Creatinine level 2.49 on admission and he had not been on diuretics as far as I know. After hydration, creatinine came down 1.06. CK was down to 694, which is near normal yesterday's date. He was ambulating with therapy. Not as good as I like. All x-rays were negative. B12 level was again negative. He was seen by psychiatry who was asked to hold all medications. Notably he did have some hypernatremia as well which was corrected with IV fluids. It was relatively mild at 149 on admission. He will be discharged to a skilled nursing facility for further therapy and watching his blood pressure, as well as his fluid status, COPD, etc.   TIME SPENT: It took approximately 35 minutes to do all discharge tasks today.   ____________________________ Marya AmslerMarshall W. Dareen PianoAnderson, MD mwa:sg D: 01/15/2013 07:29:00 ET T: 01/15/2013 07:36:13 ET JOB#: [id number]  cc: Marya AmslerMarshall W. Dareen PianoAnderson, MD,  <Dictator> Lauro RegulusMARSHALL W Illana Nolting MD ELECTRONICALLY SIGNED 01/15/2013 17:06

## 2014-06-13 NOTE — Consult Note (Signed)
PATIENT NAME:  Paul Dean, Paul Dean MR#:  [id number] DATE OF BIRTH:  21-Jun-1945  DATE OF CONSULTATION:  01/12/2013  CONSULTING PHYSICIAN:  Audery Amel, MD  IDENTIFYING INFORMATION AND REASON FOR CONSULT: This is a 69 year old gentleman with a history of bipolar disorder who was recently discharged from psychiatry and returns to the hospital with medical problems. He has rhabdomyolysis. Consultation for questions of medication management.   HISTORY OF PRESENT ILLNESS: The patient was just discharged from the hospital from the psychiatry ward on November 12 and at that time was to go to a group home. He returned to the hospital yesterday having suffered a fall. He is, as usual, having decline in his renal function and appears dehydrated and also has elevated CPKs. When I interviewed him today, he was awake in bed at 8:00 in the evening, calmly watching TV. He made good eye contact and engaged with me in conversation. Affect is smiling but generally constricted. Mood is stated as okay. Thoughts are slow, little detail. Did not make any bizarre or aggressive statements. Denied hallucinations. Denied having any suicidal or homicidal ideation. He is able to tell me that he suffered a fall at the group home and that he is having pain in his right side. He otherwise has no complaints.   PAST PSYCHIATRIC HISTORY: Long history of psychiatric treatment with a diagnosis of bipolar disorder. He frequently presents either depressed or with agitation or frequently with a combination of both. His psychiatric management has been complicated by what appears to probably be worsening dementia, partially related to his medical problems as well as his chronic medical difficulties. He has had several psychiatric hospitalizations. Does have a history of some aggression in the past. Has a history of at least voicing suicidal ideation. On his last hospital stay, he did not show any acutely dangerous behavior. He has been on a wide  variety of medications including antidepressants, antipsychotics, and mood stabilizers. As of his last discharge from the hospital, he was prescribed duloxetine 60 mg a day, mirtazapine 7.5 mg at night, and quetiapine 50 mg at night as his only psychiatric medications. This means he actually had a decrease in his total medicine load last time he was in the hospital, having discontinued his Depakote that he had previously been taking.   PAST MEDICAL HISTORY: The patient has a history of recurrent episodes of acute renal insufficiency. He has diabetes, high blood pressure, a chronic Bell's palsy, dyslipidemia, bilateral edema, gastroesophageal reflux disease. The patient is followed by Dr. Einar Crow, who is very experienced in managing Paul Dean's apparently fragile medical state.   SOCIAL HISTORY: The patient lives with his current wife and daughter and some of their extended family. He has trouble getting along with some of his wife's family. At times, he develops psychotic or at least paranoid thoughts about them. It has made living at home increasingly difficult, and this last time he was discharged to a group home. He had lived in group homes in the past, and he seemed to feel it was acceptable.   FAMILY HISTORY: Family history of mental illness.   CURRENT MEDICATIONS: At the time of admission, isosorbide 30 mg once a day, enalapril 20 mg once a day, duloxetine 60 mg a day, simvastatin 40 mg at night, fluticasone/salmeterol 1 puff 2 times a day for shortness of breath, omeprazole 20 mg once a day, mirtazapine 7.5 mg at night, quetiapine 50 mg at night.   ALLERGIES: No known drug allergies.  MENTAL STATUS EXAMINATION: The patient is in the hospital room. Made good eye contact. Normal psychomotor activity. Speech quiet but normal in tone. Affect euthymic. Mood stated as okay. Thoughts appear generally lucid. No grossly bizarre thinking. Denied auditory or visual hallucinations. Denied  suicidal or homicidal ideation. Showed chronically impaired intelligence, adequate judgment for the time being.   REVIEW OF SYSTEMS: No specific complaints except for pain in his side right now.   ASSESSMENT: The patient appears to be at his baseline mental state, neither too depressed, nor agitated. He was friendly and calm when I came to see him despite having some physical pain. The list of medications that can potentially theoretically cause rhabdomyolysis is vast and seems to include just about every psychiatric medicine, but I doubt that any of them are related to his current condition. They have been continued, and his CPK is going down. He has been on them in the past, and none of them is a very common cause of this. I would not recommend changing any of his psychiatric medicines for now. I will check up on him again while he is in the hospital. I suspect he can go back to the group home once he is medically stable again.   DIAGNOSIS, PRINCIPAL AND PRIMARY:  AXIS I: Bipolar disorder, most recent episode mixed, depressed.   SECONDARY DIAGNOSES: AXIS I: Dementia of multiple causes.  AXIS II: Deferred.  AXIS III: Hypertension, diabetes, acute renal insufficiency, acute CPK elevation with rhabdomyolysis, chronic obstructive pulmonary disease.  AXIS IV: Severe from social difficulties and not living at home.  AXIS V: Functioning at time of evaluation 50.   ____________________________ Audery AmelJohn T. Clapacs, MD jtc:jcm D: 01/12/2013 20:20:00 ET T: 01/12/2013 22:05:33 ET JOB#: [id number]  cc: Audery AmelJohn T. Clapacs, MD, <Dictator> Audery AmelJOHN T CLAPACS MD ELECTRONICALLY SIGNED 01/13/2013 17:38

## 2014-06-13 NOTE — Consult Note (Signed)
Brief Consult Note: Diagnosis: acute chest pain, SOB, drowsiness.   Patient was seen by consultant.   Consult note dictated.   Recommend further assessment or treatment.   Orders entered.   Comments: will review results and will continue following.  Electronic Signatures: Altamese DillingVachhani, Bralee Feldt (MD)  (Signed 04-Sep-14 16:16)  Authored: Brief Consult Note   Last Updated: 04-Sep-14 16:16 by Altamese DillingVachhani, Brodi Nery (MD)

## 2014-06-13 NOTE — H&P (Signed)
PATIENT NAME:  Paul Dean, KEARLEY MR#:  [id number] DATE OF BIRTH:  1945/08/03  DATE OF ADMISSION:  10/16/2012  IDENTIFYING INFORMATION AND CHIEF COMPLAINT:  A 69 year old man brought to the Emergency Room by family because of suicidal ideation and depression.   CHIEF COMPLAINT:  "I'm thinking of killing myself."   HISTORY OF PRESENT ILLNESS:  Information obtained from the patient, from the chart and from discussion with the patient's wife.  The patient himself tells me that he has been feeling bad for probably somewhere between a few days and a few weeks.  He tells me that he is having intrusive thoughts about killing himself.  He had a plan of walking out into traffic.  He says that what is making him most upset is that his wife's grandson is living with him.  He goes on about this for quite some time.  He says that he is feeling very angry and upset and irritable.  He is not sleeping well at night.  His appetite has been poor and he has lost weight.  He is having auditory hallucinations by his account that are voices telling him that he ought to kill himself.  He is not abusing other drugs, but he has been drinking a little more beer than regularly, but still it is only about three beers in the last week.  Evidently, he has been compliant with his medications, but reportedly there have recently been some changes in his medicine.  Neither the patient nor his wife are entirely sure what changes were made in his medications.   PAST PSYCHIATRIC HISTORY:  This patient has a long-standing history of mental illness and has had multiple psychiatric hospitalizations in the past, but the last one for psychiatric reasons seems to have been in 2001.  His diagnosis in the past was recurrent major depression with psychotic features.  More recently it appears that someone had given him a diagnosis of bipolar disorder, but I cannot elicit a clear-cut history of an unmistakable mania.  The patient does have a history of  suicide attempts.  It appears that he had been maintained on a combination of Zyprexa with antidepressants in the past, although it looks like more recently he had been on a combination of Abilify with antidepressants.   FAMILY HISTORY:  He does have a family history of mental illness.    SUBSTANCE ABUSE HISTORY:  Evidently there have been times in the past when the patient was drinking more heavily, but it has never been identified as a major problem for him.  No other drug use.   PAST MEDICAL HISTORY:  The patient appears to have developed worsening medical problems.  He has a history of non-insulin-dependent diabetes.  Also has high blood pressure.  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  The patient recently has been having more and more difficulty ambulating for reasons that are not immediately obvious.   SOCIAL HISTORY:  The patient lives with his wife as well as his wife's grown daughter and the daughter's son which is the person the patient appears to be so angry at.  In conversation with the wife, she says that this is particularly baffling to the family because until recently the patient and this grandson had had a good relationship and she says that the grandson never did anything, but be very helpful and polite to the patient.  She thinks that this is all delusional, his anger,  at the grandson.  The patient has an education from what I can see only through middle school.  Literacy is present, but limited.   CURRENT MEDICATIONS:  I am awaiting a fax from his pharmacy to give me a full list.  I am not sure if the list that we had at intake is completely correct.  The patient himself cannot tell me.  The intake nurse documented quetiapine 25 mg 3 times a day, trazodone 150 mg at night, Cymbalta 60 mg a day, mirtazapine 30 mg at night, enalapril 20 mg a day, omeprazole 20 mg a day, potassium supplement 20 mEq per  day, simvastatin 40 mg at night.   ALLERGIES:  No known drug allergies.   MENTAL STATUS EXAMINATION:  Elderly, sick-looking gentleman who looks older than his stated age.  He is in a hospital bed.  He made very little eye contact.  Psychomotor activity was very limited.  Affect was flat, anxious and a little bit irritated.  Mood was stated as being "ill."  Thoughts are rambling and he perseverates on negative statements about his family.  He has suicidal ideation with thoughts of jumping into traffic.  Denies homicidal ideation.  Endorses auditory hallucinations.  Judgment and insight are impaired.  Intelligence low-average.  Full cognitive testing not done, but the patient is alert and oriented x 4.   PHYSICAL EXAMINATION: GENERAL:  The patient has bilateral 4+ edema going up to his knees in both legs.  Both feet are painful and a little bit swollen, but not clearly infected.  No open skin lesions.  Right side of his face is a little droopy because he has a history of a Bell's palsy in the past.  Oral mucosa is dry.   NECK AND BACK:  Nontender.  Limited range of motion especially at the hips and lower extremities, but fairly good in the upper extremities.  Strength is normal in the upper extremities, but is impaired throughout in his lower extremities.  Cranial nerves other than the Bell's palsy appear to be intact.  LUNGS:  Clear without wheezes.  HEART:  Regular rate and rhythm.  ABDOMEN:  Nontender, normal bowel sounds.  The patient was able to stand up without assistance, but once he did he felt dizzy.  He was able to ambulate only a few steps with a very stiff leg and gait and felt off-balance so we put him back to bed.  VITAL SIGNS:  Temperature 98.4, pulse 73, respirations 18, blood pressure 168/99.   LABORATORY RESULTS:  Admission labs include a drug screen negative, liver function panel normal.  Alcohol undetected.  Glucose slightly elevated at 104, BUN elevated 25, chloride elevated 108.   CBC, low hematocrit at 33.8, low hemoglobin at 11.9.  PT and PTT were normal.  Urinalysis positive for some glucose, but otherwise unremarkable.   ASSESSMENT:  A 69 year old man with a history of recurrent severe psychotic depression who is presenting with altered mental status with symptoms consistent with psychotic depression, although it is not impossible to rule out a manic or mixed state.  Possible precipitants could be recent changes in medicine.  Also could possibly be related to medical issues.  The patient is voicing suicidal ideation and needs hospital treatment.   TREATMENT PLAN:  Admit to psychiatry.  He has a Comptrollersitter for now.  He will continue his medicine, although on trying to find out exactly what he used to be on so that we can try to go back to that.  Recheck labs.  Probably get a medicine consult to make sure that we were dealing with his edema and his medical problems appropriately.  Try and engage him in groups and educate the family as well.   DIAGNOSIS, PRINCIPAL AND PRIMARY:  AXIS I:  Major depression, recurrent, severe, with psychotic features.   SECONDARY DIAGNOSES: AXIS I:  Deferred.  AXIS II:  Deferred.  AXIS III:  High blood pressure, history of diabetes, history of Bell's [place], dyslipidemia, bilateral edema, recent pneumonia within the last year.  AXIS IV:  Severe from medical problems and chronic illness.  AXIS V:  Functioning at time of evaluation 23.     ____________________________ Audery Amel, MD jtc:ea D: 10/16/2012 23:13:29 ET T: 10/16/2012 23:28:29 ET JOB#: [id number]  cc: Audery Amel, MD, <Dictator> Audery Amel MD ELECTRONICALLY SIGNED 10/17/2012 10:48

## 2014-06-13 NOTE — Consult Note (Signed)
Psychiatry: Patient who was transferred to the medical service yesterday. He suffers from chronic mood instability. He had been on the behavioral health service with a depression accompanied by confusion but then developed into agitation. He had seem to be resolving 2 near his baseline when he became acutely short of breath in sick yesterday. Today he seems to be feeling much better. He was awake and alert and oriented in sitting up. He was not complaining of chest pain or being short of breath. Patient stated that he was happy to go back home to be with his wife. He did not seem to be as concerned about his wife's grandson as he had previously been. He is tolerating medicines well. Patient clearly is a fragile fellow in terms of his medical condition. I'm glad cardiology has seen him as well. He has followup in the community. At this point I don't think he necessarily need to transfer her back to the behavioral health unit prior to discharge although if that is necessary over the weekend please call the psychiatrist on call.  Electronic Signatures: Amritpal Shropshire, Jackquline DenmarkJohn T (MD)  (Signed on 05-Sep-14 22:39)  Authored  Last Updated: 05-Sep-14 22:39 by Audery Amellapacs, Daelin Haste T (MD)
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
## 2014-06-13 NOTE — Consult Note (Signed)
Brief Consult Note: Diagnosis: bipolar disorder depressed.   Patient was seen by consultant.   Consult note dictated.   Comments: Psychiatry: Patient just recently discharged from psychiatry to a group home returned to the hospital after a fall. Chart reviewwed and pt seen. CPK coming down. Renal function improving. I do not think recent psych meds were to blame for this event. Paul Dean is a frail patient prone to falls. Current meds seemed ok when he was discharged and he is mentally stable now. Would continue current medication.  Electronic Signatures: Paul Dean, Paul Dean (MD)  (Signed [id number]-Nov-14 19:57)  Authored: Brief Consult Note   Last Updated: 22-Nov-14 19:57 by Paul Dean, Paul Dean (MD)

## 2014-06-13 NOTE — Consult Note (Signed)
PATIENT NAME:  Paul Dean, Paul Dean MR#:  [id number] DATE OF BIRTH:  1946/02/22  DATE OF CONSULTATION:  10/17/2012  REFERRING PHYSICIAN:  Mordecai RasmussenJohn Clapacs, MD CONSULTING PHYSICIAN:  Krystal EatonShayiq Tiger Spieker, MD  PRIMARY CARE PHYSICIAN:  [facility].   REASON FOR CONSULTATION: Elevated blood pressure.   HISTORY OF PRESENT ILLNESS: The patient is a 69 year old Caucasian male with a history of severe depression with severe psychiatric disease and bipolar disorder, who was currently admitted to Behavioral Medicine for suicidal ideation and depression. He also had hypertension, GERD and chronic renal failure. We are consulted for elevated blood pressure. His blood pressure has been elevated yesterday and today, yesterday was 168/99 and today was 170/68. He is on enalapril. He denies having any pains in the chest but has had increased swelling in the legs and has had decreased mobility. He walks with walker and also a wheelchair. He is also a diabetic but he states that he was told that his diabetes has resolved and he is not diabetic anymore.   PAST MEDICAL HISTORY 1.  Hypertension.  2.  History of type 2 diabetes.  3.  History of major depression with severe psychiatric disease and multiple psych admissions.  4.  Bipolar disorder.  5.  History of chronic renal failure.  6.  GERD.   7.  COPD.   8.  History of sleep apnea, not on CPAP.  9.  Hyperlipidemia.  10.  History of Bell's palsy with residual left-sided facial weakness.  12.  History of alcohol abuse.   ALLERGIES: No known drug allergies.   OUTPATIENT MEDICATIONS: Enalapril 20 mg daily, duloxetine 60 mg daily, Klor-Con 20 mEq 1 tab 2 times a day, mirtazapine 30 mg once a day at bedtime, omeprazole 20 mg daily, quetiapine 25 mg 3 times a day, simvastatin 40 mg a day, tamsulosin 0.4 mg daily, trazodone 150 mg at bedtime,   FAMILY HISTORY: No family history of diabetes, hypertension or CAD per chart.   SOCIAL HISTORY: Smokes a  pack a day, denies alcohol or drug use.   REVIEW OF SYSTEMS CONSTITUTIONAL: Feels like he lost weight, is overall weak. No fever, occasional chills.  EYES: Had a cataract removed from the right eye.  ENT: History of Bell's palsy on the left.   RESPIRATORY: Occasional cough. Some dyspnea on exertion. No history of COPD or hemoptysis. No painful respirations.  CARDIOVASCULAR: No chest pain. Swelling in the legs. History of hypertension. Denies history of heart failure. Occasional palpitations.  GASTROINTESTINAL: No nausea, vomiting, diarrhea, hematemesis, melena or ulcers. Has a history of GERD. GENITOURINARY:  Has occasional dysuria, has some difficulty with urination at times.  ENDOCRINE: Denies polyuria or nocturia. No thyroid problems.  HEMATOLOGIC AND LYMPHATIC: No anemia or easy bruising.  SKIN: Denies rashes.  MUSCULOSKELETAL: Denies arthritis, gout.  PSYCHIATRIC: Has depression.   PHYSICAL EXAMINATION VITAL SIGNS: Temperature today was 97.6, pulse rate 84, respiratory rate 18, blood pressure 170/68. O2 sat was 100% yesterday, today not noted.  GENERAL: The patient is a chronically ill-appearing Caucasian male sitting on his bed.  HEENT: Normocephalic, atraumatic. Pupils appear to be equal and reactive. Moist mucous membranes.  NECK: Supple. No JVD. No thyroid tenderness.  CARDIOVASCULAR: S1 and S2, regular rate and rhythm. No gallops, murmurs or rubs.  LUNGS: Clear to auscultation without wheezing, rhonchi or rales.  ABDOMEN: Soft, no significant tenderness. Positive bowel sounds in all quadrants.  EXTREMITIES: The patient has 3+ lower extremity edema to below the knee on  the right and 2+ lower extremity edema on the left. Right lower extremity appears to be more distended than the left.  There is some erythema to both with some chronic venous stasis changes without any warmth.  NEUROLOGIC:  The patient has significant noticeable Bell's palsy on the left. Strength appears to be 5 out  of 5 in all extremities.  PSYCHIATRIC: Awake, alert, oriented, pleasant.   LABORATORY DATA:  Today BUN is 18, creatinine 1.1, sodium 141, potassium 4.1 and glucose 112. LFTs within normal limits. Troponin 2 days ago was negative. TSH today is 0.78. UA yesterday no nitrites or leukocyte esterase, 1 RBC, and 1 WBC, no bacteria. WBC was 4.3, hemoglobin 12.2, platelets 180 today. EKG from a couple of days ago showed no acute ST elevations or depressions. Rate is 86, normal sinus rhythm.   ASSESSMENT AND PLAN: We have a  69 year old male with hypertension, history of diabetes, who is on no medications for it with depression, bipolar and psychiatric admissions in the past. He was admitted to Behavioral Medicine for suicidal ideation and depression. We were consulted for help with blood pressure medicines. He is on enalapril. He appears to be somewhat volume overloaded as well. In regards to the blood pressure, will go ahead and add hydrochlorothiazide 12.5 mg daily and also give a 1-time dose of Lasix today. That should help with the blood pressure. I suspect the patient might also have underlying congestive heart failure, unknown if systolic or diastolic. He does not have an echocardiogram in the chart and I will go ahead and obtain a BNP as well as an echocardiogram for now. The lower extremity edema should help improve with the Lasix as well. If the patient does have congestive heart failure, then  cardiology can be consulted or he could follow up with cardiology as an outpatient depending on the severity and at that time his blood pressure medications and cardiac medications could be readjusted with the addition of a beta blocker. He does not appear to have any hypoxia. He does not appear to have any crackles in the lungs. However, he has significant lower extremity edema. It appears that the patient has more edema on the right than the left which raises the possibility of possible underlying deep vein  thrombosis. I would obtain an ultrasound of the right lower extremity to evaluate and rule that out. He does not have a hemoglobin A1c although he has a couple of elevated blood sugars. We would get that as well. He does have tobacco abuse and he still smokes, and he was counseled. Initially stated that he does not want to give it up but later said that he will go ahead and give it up. I would start him on a nicotine patch as well. I would recommend continuing his cholesterol medications. He is on proton pump inhibitor for his reflux. Suicidal ideation and depression per psychiatry.   Thank you for this kind consult and we will follow along with you.   ____________________________ Krystal Eaton, MD sa:cs D: 10/17/2012 18:27:39 ET T: 10/17/2012 18:44:22 ET JOB#: [id number]  cc: Krystal Eaton, MD, <Dictator> Marya Amsler. Dareen Piano, MD  Krystal Eaton MD ELECTRONICALLY SIGNED 11/13/2012 13:52

## 2014-06-13 NOTE — H&P (Signed)
PATIENT NAME:  Paul Dean, Paul Dean MR#:  [id number] DATE OF BIRTH:  1945-11-22  DATE OF ADMISSION:  01/11/2013  ADMITTING PHYSICIAN: Gladstone Lighter, MD   PRIMARY CARE PHYSICIAN:  Frazier Richards, MD.   CHIEF COMPLAINT:  Fall and weakness.   HISTORY OF PRESENT ILLNESS:  Paul Dean is a 69 year old Caucasian male with past medical history significant for hypertension, bipolar disorder with depression and recent hospitalization, a history of diabetes, Bells' palsy with chronic left facial droop, who is from a group home, who was brought in secondary to a fall last night and extreme weakness. The patient is a poor historian. According to him, he was living at home with his wife and was recently in the hospital and was monitored by Behavior Medicine physicians for 3 days while in the ER secondary to depression symptoms with suicidal ideation due to poor home situation. The  patient apparently was discharged to a group home, has been doing well up until last night when he said he is felt weak and lost balance and fell to the floor. He fell and hit his head on the right side, denies any loss of consciousness. No chest pain. No syncopal episodes. He felt much weaker, could not move and was brought to the ER today. Noted to be in acute renal failure. Labs from 2 weeks ago show normal renal function. Also rhabdomyolysis with elevated CPK and elevated troponins so he is being admitted.   PAST MEDICAL HISTORY:  1.  Hypertension.  2.  Hyperlipidemia.  3.  A history of diet-controlled diabetes.  4.  Bell's palsy with left facial droop. 5.  Bipolar disorder with depression.  6.  Gastroesophageal reflux disease.   PAST SURGICAL HISTORY:  Right eye cataract surgery.   ALLERGIES:  No known drug allergies.    CURRENT HOME MEDICATIONS:  1.  Imdur 30 mg p.o. daily.  2.  Simvastatin 40 mg p.o. daily.  3.  Enalapril 20 mg p.o. daily.  4.  Mirtazapine 7.5 mg p.o. at bedtime.  5.  Duloxetine 60 mg p.o. daily.   6.  Advair 250/50, one puff b.i.d.  7.  Prilosec 20 mg p.o. daily.  8.  Seroquel 50 mg p.o. at bedtime.   SOCIAL HISTORY:  Currently from group home, otherwise married with his second wife. He smokes about a pack per day until last week, use to have a prior history of alcohol abuse, none currently.   FAMILY HISTORY:  Father died with a history of renal stone, otherwise no medical problems. Mother had multiple strokes.   REVIEW OF SYSTEMS:  CONSTITUTIONAL:  No fever. Positive for fatigue and weakness.  EYES:  No blurred vision, double vision, inflammation, glaucoma or cataracts.  ENT:  No tinnitus, ear pain, hearing loss, epistaxis or discharge.  RESPIRATORY:  No cough, wheeze, hemoptysis or COPD.  CARDIOVASCULAR:  No chest pain, orthopnea, edema, arrhythmia, palpitations or syncope. GASTROINTESTINAL:  Nausea, vomiting, diarrhea, abdominal pain, hematemesis or melena.  GENITOURINARY:  No dysuria, hematuria, renal calculus, frequency or incontinence.  ENDOCRINE:  No polyuria, nocturia, thyroid problems, heat or cold intolerance.  HEMATOLOGY:  No anemia, easy bruising or bleeding.  SKIN:  No acne, rash or lesions.  MUSCULOSKELETAL:  No neck, back, shoulder pain, arthritis or gout.  NEUROLOGICAL:  No numbness, weakness, CVA, TIA or seizures.  PSYCHIATRIC:  No anxiety, insomnia or depression.   PHYSICAL EXAMINATION:  VITAL SIGNS:  Temperature 97.4 degrees Fahrenheit, pulse 81, respirations 20, blood pressure 124/70, pulse ox 94% on room  air.  GENERAL:  Well-built, well-nourished male lying in bed, not in any acute distress.  HEENT:  Normocephalic. The patient had a couple of abrasions on the right side to which Steri-Strips are applied. Otherwise nontraumatic. Pupils are equal, round, reacting to light. Anicteric sclerae. Extraocular movements intact. Oropharynx clear without erythema, mass or exudates. Dry mucous membranes.   NECK:  Supple. No thyromegaly, JVD or carotid bruits. No  lymphadenopathy.  LUNGS:  Moving air bilaterally. No wheeze or crackles. No use of accessory muscles for breathing.  CARDIOVASCULAR:  S1, S2 regular rate and rhythm. No murmurs, rubs or gallops. ABDOMEN:  Soft, nontender, nondistended. No hepatosplenomegaly. Normal bowel sounds.  EXTREMITIES:  No pedal edema. No clubbing or cyanosis, 2+ dorsalis pedis pulses palpable bilaterally.  SKIN:  No acne, rash or lesions.  LYMPHATICS:  No cervical or inguinal lymphadenopathy.  NEUROLOGIC:  Cranial nerves seem to be intact except 7th nerve has left-sided facial palsy secondary to a history of Bell's palsy. Motor strength is 5/5 all 4 extremities with some tremors noted. Generalized weakness noticed. Sensation is intact.  PSYCHOLOGICAL:  The patient is awake and alert, oriented x 3 just slow to respond.   LABORATORY DATA:  WBC 9.6, hemoglobin 13.3, hematocrit 39.2, platelet count 154.   Sodium 149, potassium 3.5, chloride is 118, bicarb 24, BUN 25, creatinine 2.49, glucose 128 and calcium of 8.9.   ALT 86, AST 119, alk phos 73, total bili 0.7 and albumin of 3.8. Troponin is elevated at 0.11. CK is elevated at 6719, CK-MB at 12.2.  CT of the C-spine showing no acute intracranial injury. Chest x-ray and renal ultrasound are pending at this time. Urinalysis with  2+ blood and no RBCs.   EKG showing normal sinus rhythm, no acute ST-T wave abnormalities.   ASSESSMENT AND PLAN:  This is a 69 year old male with history of bipolar disorder with depression, hypertension, dyslipidemia, Bell's palsy with left facial droop, comes from group home after a fall and noted to have rhabdomyolysis, acute renal failure and elevated troponin.  1.  Acute renal failure, likely from rhabdomyolysis. Continue IV fluids. Renal ultrasound has been ordered. Recent creatinine two weeks ago was 1.45, recheck in a.m., hold enalapril. 2.  Rhabdomyolysis. Follow elevated CPK. Continue IV fluids. Recheck CPK level in the morning.  3.   Elevated troponins, likely from renal failure and rhabdo. Recycle enzymes, most likely to be non-STEMI. Normal EKG. Continue to monitor on off-unit telemetry. We will hold off on anticoagulation at this time. 4.  Hypertension. Continue Imdur, hold enalapril.  5.  Bipolar disorder with depression. Continue home medications, not suicidal currently. He is from group home. 6.  Tobacco use disorder. Counseled for 3 minutes, starting on nicotine patch while in the hospital.  7.  Gastrointestinal and deep venous thrombosis prophylaxis on Prilosec and subcutaneous heparin.   CODE STATUS:  FULL CODE.   TIME SPENT ON ADMISSION:  50 minutes.   ____________________________ Gladstone Lighter, MD rk:jm D: 01/11/2013 17:12:55 ET T: 01/11/2013 18:03:47 ET JOB#: [id number]  cc: Gladstone Lighter, MD, <Dictator> Ocie Cornfield. Ouida Sills, MD Gladstone Lighter MD ELECTRONICALLY SIGNED 01/15/2013 18:25

## 2014-06-13 NOTE — Discharge Summary (Signed)
PATIENT NAME:  Paul Dean, [name] MR#:  [id number] DATE OF BIRTH:  06/20/1945  DATE OF ADMISSION:  10/25/2012 DATE OF DISCHARGE:  10/27/2012   HISTORY OF PRESENT ILLNESS: Mr. Paul Dean is a 69 year old white gentleman who was on the behavioral unit for severe depression when he developed chest pain and shortness of breath. He was seen by the hospitalist, who transferred him to the general medical floor for further evaluation.   PAST MEDICAL HISTORY: Notable for: 1. Hypertension.  2. History of type 2 diabetes.  3. Severe depression.  4. Bipolar disorder.  5. Chronic renal insufficiency. 6. Gastroesophageal reflux disease.  7. COPD.  8. Sleep apnea. 9. Hyperlipidemia.  10. 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 11. History of alcohol abuse.   MEDICATIONS AT THE TIME OF TRANSFER: Include:  1. Depakote 875 mg at bedtime.  2. Enalapril 20 mg daily. 3. Remeron 45 mg at bedtime.  4. Omeprazole 20 mg b.i.d. 5. Simvastatin 40 mg at bedtime. 6. Flomax 0.4 mg after meals.  7. Trazodone 50 mg at bedtime p.r.n. 8. Lorazepam 1 mg at bedtime p.r.n.   ADMISSION PHYSICAL EXAMINATION: Most notable for his left-sided facial weakness. He had a few crackles on auscultation of the chest. He had a slight tachycardia. The remainder of the exam was basically unremarkable.   LABORATORY DATA: Blood sugar was 98. BNP was 122. BUN was 33 with a creatinine 1.45, sodium 140, potassium 4.3, chloride 106, CO2 of 32, calcium 9. White count 5000, hemoglobin 11.5, platelet count 179,000.   HOSPITAL COURSE: The patient was transferred to the regular medical floor, where he was placed on telemetry. He had a chest CT that ruled out PE. He was placed back on Advair, which apparently he had taken in the past for his COPD, for what was felt to be a mild COPD flare. He was seen in consultation by cardiology. He did have a stress test that showed a possible small perfusion defect, but he was asymptomatic, and  cardiology cleared him for discharged on Imdur 30 mg daily.   DISCHARGE DIAGNOSES:  1. Coronary artery disease with angina.  2. Chronic obstructive pulmonary disease flare.   DISCHARGE DISPOSITION: The patient was discharged on a low sodium diet and a low fat diet, with activity as tolerated. He was placed on Imdur 30 mg daily and was ambulated without difficulty. He is to follow up with Dr. Dareen PianoAnderson next week. He will see Dr. Gwen PoundsKowalski in 1 to 2 weeks.   ____________________________ Paul PateJohn B. Dean Paul III, MD [id number] D: 10/27/2012 10:49:25 ET T: 10/27/2012 11:12:54 ET JOB#: [id number]  cc: Paul PateJohn B. Dean Paul III, MD, <Dictator> Elmo PuttJOHN B WALKER III MD ELECTRONICALLY SIGNED 10/28/2012 11:26

## 2014-06-13 NOTE — Consult Note (Signed)
PATIENT NAME:  Paul Dean, Paul Dean MR#:  [id number] DATE OF BIRTH:  August 29, 1945  DATE OF CONSULTATION:  10/25/2012  REFERRING PHYSICIAN:  Dr. Mordecai Rasmussen.  CONSULTING PHYSICIAN:  Irini Leet G. Elisabeth Pigeon, MD  REASON FOR MEDICAL CONSULT:  Chest pain and drowsiness.   HISTORY OF PRESENT ILLNESS:  This is a 69 year old Caucasian male with a history of severe depression and psychiatric disease, bipolar disorder who was admitted to Behavioral Medicine for suicidal catheterization and depression. Other medical issues are hypertension, type 2 diabetes, major depression, GERD, COPD, sleep apnea, hyperlipidemia, and has been having Bell's palsy with residual left-sided facial weakness, has been improving from his psychiatrist point of view and medical consult was done earlier for high blood pressure management, which was under control nicely until today. As per the floor nurse, the patient was not having good sleep last night and today in the afternoon, he complained about chest pain, and he was a little more drowsy so they reconsulted the medical team. His primary care physician is Dr. Einar Crow but as he was not able to come and see the patient immediately and this was urgent consult so they called the hospitalist team to come and see the patient. When I went to see the patient, he was sitting outside in the lobby in the Behavior Floor in a recliner, slightly drowsy, but easily arousable and using nasal cannula supplemental oxygen, having some drooping of his Maalox from his mouth on the left side, and on asking, he says that he has chest pain. It was sternal area which is constant and like 7 to 8/10, dull, pressure-like. Denied any cough or palpitations. He felt a little short of breath initially and was anxious also when Dr. Toni Amend saw him, so they tried to calm him down, gave him Maalox, let him sit in the chair. At that time his saturation was less than 90% but after having him relax, his saturation came up  to 94 to 95 and that was on room air so they still started on nasal cannula supplementation oxygen. The patient denies any fever or any cough or any new onset headache or weakness. He denies any palpitations history.  REVIEW OF SYSTEMS: CONSTITUTIONAL:  Negative for fever, fatigue, weakness. Positive for chest pain. No weight loss or weight gain.  EYES:  No blurring or double vision, pain or redness.  EARS, NOSE, THROAT:  No tinnitus, ear pain or hearing loss or discharge.  RESPIRATORY:  No cough, wheezing, but has some mild shortness of breath.  CARDIOVASCULAR:  Has chest pain, which is retrosternal. No orthopnea, edema, or arrhythmia or palpitations.  GASTROINTESTINAL:  No nausea, vomiting, diarrhea or abdominal pain.  GENITOURINARY:  No dysuria, hematuria or increased frequency or incontinence of the urine.  ENDOCRINE:  No increased sweating or heat or cold intolerance.  SKIN:  No acne or rashes or lesions on the skin.  MUSCULOSKELETAL:  No pain or swelling in the joints, but his legs are chronically swollen, nontender.  NEUROLOGICAL:  No numbness, weakness or dysarthria. He has chronic left-sided facial weakness due to Bell's palsy, which is stable.  PSYCHIATRICALLY:  Currently appears stable and does not have any complaints.   PAST MEDICAL HISTORY:  1.  Hypertension.  2.  A history of type 2 diabetes.  3.  Major depression with severe psychiatric disease.  4.  Bipolar disorder.  5.  Chronic renal failure.  6.  Gastroesophageal reflux disease.  7.  COPD.  8.  Sleep apnea, CPAP.  9.  Hyperlipidemia.  10.  Bell's palsy with residual left-sided facial weakness.  11.  Alcohol abuse.  FAMILY HISTORY:  No family history of diabetes, hypertension or coronary artery disease as per chart.   SOCIAL HISTORY:  He smoked 1 pack per day. Denies alcohol or drug use.   CURRENT MEDICATIONS in Psych: 1.  Acetaminophen tablet 650 mg q.4 hours as needed for headache and fever. 2.  Maalox 30 mL  every 4 hours for indigestion or heartburn.  3.  Depakote 875 mg oral at bedtime.  4.  Enalapril 20 mg daily.  5.  Magnesium hydroxide 30 mL as needed for constipation.  6.  Mirtazapine 45 mg at bedtime.  7.  Omeprazole 20 mg twice daily.  8.  Simvastatin 40 mg at bedtime.  9.  Tamsulosin 0.4 mg after meals.  10.  Trazodone 50 mg at bedtime as needed for insomnia.  11.  Lorazepam 1 mg at bedtime for insomnia.  12.  Nicotine patch.  VITAL SIGNS:  The patient is afebrile. Temperature 97.8, as per the nursing records. His heart rate was 123 at 2:20 p.m., and which came down to 108 after a few minutes. Respiratory rate was 20, blood pressure was 117/68 and that went down to 106/60, pulse ox was 94% on 2 L nasal cannula supplementation.  PHYSICAL EXAMINATION: HEENT:  Head and neck atraumatic. Face is deviated toward right-sided, left-sided weakness. There is some drooling of Maalox what he said he drank from left side of the face angle of the mouth. Conjunctiva pink. Oral mucosa moist.  NECK:  Supple. No JVD.  RESPIRATORY:  Bilateral clear and equal air entry. A few crepitations heard.  CARDIOVASCULAR:  S1, S2 present, regular. No murmurs, some tachycardia present.  ABDOMEN:  Soft, nontender. Bowel sounds present. No organomegaly.  SKIN:  Right leg some redness and warmth on the palpation. No rashes anywhere else.  LEGS:  Bilateral leg edema present.  NEUROLOGICAL:  Power 4/5, generalized weakness. Follows commands. No tremors or rigidity.  PSYCHIATRIC:  He does not appear in acute psychiatric illness at this time. He is comfortable and lying down in the chair in recliner, a little bit sleepy, but arousable.   IMPORTANT LABORATORY RESULTS AS PER THE LABS DONE TODAY:  Glucose 98. BNP was 122, BUN 33, and creatinine 1.45. His creatinine earlier was 1.10, sodium 140, potassium 4.3, chloride 106 and CO2 of 32, calcium was 9. WBC 5, hemoglobin 11.5, platelet count 179 and MCV 98. ABG is done which is  pH 7.45, pCO2 43 and pO2 is 73 on 28% FiO2   ASSESSMENT AND PLAN:  A 69 year old male with multiple past medical history and psychiatric history, was admitted to Psych for major depressive disorder. Medical consult was called in urgently because of complaint of chest pain and a little drowsiness.  1.  Chest pain. The patient has complaint of retrosternal chest pain, which that started around 2:30 when he was lying down in his recliner, has history of hypertension, diabetes, and he is an active smoker plus hyperlipidemia. We will check his troponins. The first troponin is ordered stat and then we will follow eight hours as needed. We will transfer him to telemetry floor for now and continue monitoring there further. An echocardiogram was done recently in this admission, which showed some impaired relaxation pattern but his ejection fraction was fine, so we will not repeat any workup at this time.  2.  Hypoxia. The patient had acute respiratory failure. His oxygenation  pO2 is 73 on 28% FiO2 via nasal cannula while usually he does not use any oxygen supplementation. I would like to wait for his chest x-ray, and if x-ray does not show any reason for hypoxia, then we might need to do further workup for deep vein thrombosis studies though his right leg deep vein thrombosis study was just done recently, which was negative, but still with hypoxia and chest pain and no other reason to explain it, we might need to do further workup. 3.  A history of chronic obstructive pulmonary disease. The patient does not have much wheezing right now but in the view of him having hypoxia and chest tightness, we will give him DuoNeb and see if that helps to improve his oxygen saturation.  4.  Residual hypotension. The patient's blood pressure usually remains 140 to 150, and his primary care doctor, Einar CrowMarshall Anderson, confirmed that if his blood pressure is brought down to normal or lower normal, he might start feeling orthostatic, so  he advised to keep it in that range. Today the patient's blood pressure is 106/68 and he is feeling a little drowsy, maybe that is the cause.  5.  Dehydration. The patient has creatinine rise in the last few days in hospital. He looks like dehydrated. We will give him some IV fluids and maybe that is also the reason of him having low blood pressure and slight drowsiness. We will check the correction of renal function tomorrow with BNP.  6.  Other medical issues:  Sleep apnea, hyperlipidemia, diabetes are stable and we will not change any medicational plan at this time about them.   PRIMARY CARE PHYSICIAN:  Dr. Einar CrowMarshall Anderson [facility]. Meanwhile, we will transfer the patient to medical floor for further management and better monitoring and we will continue following his labs and his improvement.   TOTAL TIME SPENT ON THIS CONSULT:  50 minutes.  ____________________________ Hope PigeonVaibhavkumar G. Elisabeth PigeonVachhani, MD vgv:jm D: 10/25/2012 17:15:00 ET T: 10/25/2012 17:45:09 ET JOB#: [id number]  cc: Hope PigeonVaibhavkumar G. Elisabeth PigeonVachhani, MD, <Dictator> Altamese DillingVAIBHAVKUMAR Sharronda Schweers MD ELECTRONICALLY SIGNED 10/27/2012 15:40

## 2014-06-14 NOTE — H&P (Signed)
PATIENT NAME:  Paul Dean, Paul Dean MR#:  [id number] DATE OF BIRTH:  Nov 04, 1945  DATE OF ADMISSION:  09/30/2013  CHIEF COMPLAINT: Right ureteral stent.     HISTORY OF PRESENT ILLNESS: Paul Dean presented to the Emergency Room with right renal colic due to a stone and underwent right stent placement on July 5. The stone had already migrated into the bladder at that time and was flushed out. He comes in now for cystoscopy with stent retrieval.   ALLERGIES: No drug allergies.   HOME MEDICATIONS: Included: Simvastatin, carvedilol,  duloxetine, quetiapine,  lisinopril and trazodone.   PAST SURGICAL HISTORY:  Included:  1.  Right cataract surgery.  2.  Lithotripsy in 2010.   SOCIAL HISTORY: The patient smokes a pack a day and has a 50 pack-year history. He denied alcohol use.   FAMILY HISTORY: Remarkable for kidney stones.   PAST AND CURRENT MEDICAL CONDITIONS:  1.  Diabetes.  2.  Hyperlipidemia.  3.  Hypertension.  4.  Bell palsy with left facial droop.  5.  Gastroesophageal reflux disease.  6.  Bipolar disorder.  7.  History of recurrent kidney stones.   REVIEW OF SYSTEMS: The patient denies chest pain, shortness of breath, stroke, or coronary artery disease.    PHYSICAL EXAMINATION:  GENERAL: An elderly white male in no acute distress.  HEENT: Sclerae were clear. Pupils were equally round and reactive to light and accommodation.  NECK: Supple. No palpable cervical adenopathy.  LUNGS: Clear to auscultation.  CARDIOVASCULAR: Regular rhythm and rate without audible murmurs.  ABDOMEN: Soft and nontender abdomen.  GENITOURINARY AND  RECTAL: Deferred.  NEUROMUSCULAR: Alert and oriented x 3.   IMPRESSION: Right ureteral stent.   PLAN: Cystoscopy with stent retrieval.     ____________________________ Suszanne ConnersMichael R. Evelene CroonWolff, MD mrw:ms D: 09/25/2013 13:22:40 ET T: 09/25/2013 13:39:04 ET JOB#: [id number]  cc: Suszanne ConnersMichael R. Evelene CroonWolff, MD, <Dictator> Orson ApeMICHAEL R Tarshia Kot MD ELECTRONICALLY SIGNED  09/25/2013 16:58

## 2014-06-14 NOTE — Consult Note (Signed)
PATIENT NAME:  Paul Dean, Paul Dean MR#:  [id number] DATE OF BIRTH:  1945/10/18  DATE OF CONSULTATION:  10/14/2013  REFERRING PHYSICIAN:   CONSULTING PHYSICIAN:  Audery Amel, MD  IDENTIFYING INFORMATION AND REASON FOR CONSULT: A followup note on this 69 year old man with a history of bipolar disorder and multiple medical problems who is currently in the Emergency Room. I first saw the patient on August 21 when he was first brought to the Emergency Room. At that point, he was making hostile and threatening statements and was more agitated. I had placed admission orders at that time. Here we are 3 days later and the patient has still not been admitted to the psychiatry ward despite my order and despite Dr. Ranelle Oyster order of the following day. Meanwhile, the patient has been taking his regular medication. On re-evaluation today, the patient denies any suicidal or homicidal ideation. He denies any intention or thought about hurting his wife's grandson or his wife. He states that his mood is sad because his wife says that he cannot come back home. The patient has been compliant with medications since being here.   PAST PSYCHIATRIC HISTORY: Long-standing history of mental health problems as well as multiple severe medical problems which have been getting gradually worse with time to the point where he is nearly bedbound. Has been stable on antidepressants and low to medium dose of antipsychotics recently.   REVIEW OF SYSTEMS: The patient denies suicidal or homicidal ideation. Denies hallucinations. Denies feeling overwhelmingly depressed. Denies anorexia. He does have some chronic pain, difficulty walking, feelings of weakness.   MENTAL STATUS EXAMINATION: Elderly sick-appearing gentleman in the Emergency Room. Cooperative with the interview. Eye contact intermittent. Psychomotor activity limited by medical problems. Speech is decreased in total amount, easy to understand. Affect is tearful, but reactive. Mood  stated as being upset that his wife will not take him home. Denies suicidal or homicidal ideation. Denies hallucinations. Alert and oriented to situation and time and person. Could recall 1/3 objects at two minutes. The rest of the full Mini-Mental status exam was not done.   CURRENT MEDICATIONS: Cymbalta 60 mg a day, Coreg 6.25 mg per day, Uribel 1 capsule 4 times a day, lisinopril 10 mg per day, potassium chloride 20 mEq per day, simvastatin 20 mg at night, torsemide 10 mg a day, trazodone 50 mg at night, and now I am starting him back on his quetiapine 200 mg at night.   ALLERGIES: No known drug allergies.   ASSESSMENT: After 3 days of taking his medicine and calming down here in the Emergency Room, the patient appears to have stabilized and improved where he no longer requires hospitalization on the psychiatric ward. At this point, there does not appear to be an acute dangerousness to self or others. The patient's medical condition renders him extremely unlikely to be able to act on any threatening behavior in any case, but he is clearly denying suicidal or homicidal ideation now. At this point, I think he can be released psychiatrically from the Emergency Room.   TREATMENT PLAN: Discontinue IVC. Cancel admission orders. Recommend release from the Emergency Room. We are trying to contact his family to get him taken back home. Continue medication as previously. The patient counseled to stay on medication and try and work on continuing to keep his temper under control.   DIAGNOSIS; PRINCIPAL AND PRIMARY:  AXIS I: Bipolar disorder, mixed. SECONDARY DIAGNOSES: AXIS I: Dementia, mixed etiology with some behavioral disturbance.   ____________________________ Paul Dean  Paul Carrow. Isauro Skelley, MD jtc:NT D: 10/14/2013 16:52:19 ET T: 10/14/2013 17:08:14 ET JOB#: [id number]  cc: Audery AmelJohn T. Ainhoa Rallo, MD, <Dictator> Audery AmelJOHN T Ayanna Gheen MD ELECTRONICALLY SIGNED 11/01/2013 16:58

## 2014-06-14 NOTE — H&P (Signed)
PATIENT NAME:  Paul Dean, Paul Dean MR#:  [id number] DATE OF BIRTH:  08/23/1945  DATE OF ADMISSION:  08/24/2013  PRIMARY CARE PHYSICIAN:  Dr. Einar Crow.  REFERRING PHYSICIAN:  Dr. Dolores Frame.  CHIEF COMPLAINT:  Right-sided flank pain.   HISTORY OF PRESENT ILLNESS:  The patient is a 69 year old Caucasian male with a past medical history of congestive heart failure, GERD, hypertension, hyperlipidemia, diet-controlled diabetes mellitus, Bell's palsy with left facial droop and bipolar disorder is presenting to the ED with a chief complaint of right-sided flank pain.  The patient is reporting that he is in his usual state of health until around 4:00 p.m. yesterday.  At around 4:30 to 5:00 p.m. he started having severe right-sided flank pain radiating to the right lower quadrant of the abdomen.  The pain is sharp and throbbing in nature.  It is 9 out of 10 with no significant radiation.  The patient is reporting that this pain is associated with nausea and vomiting 2 to 3 times.  He also has 1 to 2 rounds of loose bowel movements.  No similar complaints in the past.  Denies any fever.  Came into the ED, CAT scan of the abdomen has revealed right-sided UVJ stone associated with moderate to severe hydronephrosis.  The patient was given pain medication and IV fluids.  Subsequently the ER physician has discussed with the on-call urologist who has recommended to admit the patient to hospitalist service and he is going get a lithotripsy done as soon as possible.  During my examination, the patient is feeling a little better, but still complaining of pain 4 to 5 out of 10.  No family members at bedside.  No other complaints.  No similar complaints in the past.  Denies any chest pain or shortness of breath.   PAST MEDICAL HISTORY:  Diet-controlled diabetes mellitus, hyperlipidemia, hypertension, Bell's palsy with left facial droop, GERD, bipolar disorder with depression.   PAST SURGICAL HISTORY:  Right eye cataract  surgery.   ALLERGIES:  No known drug allergies.   PSYCHOSOCIAL HISTORY:  Lives at home with wife, stepdaughter.  Smokes 1 pack a day.  Denies alcohol or illicit drug usage.   FAMILY HISTORY:  Father died with a history of renal stone.  Otherwise, no significant medical problems.  Mom had multiple strokes in the past.   HOME MEDICATIONS:  Simvastatin 40 mg by mouth once daily, quetiapine 50 mg 1 tablet by mouth once daily, potassium chloride 10 mEq by mouth once daily, omeprazole 20 mg 1 capsule by mouth once a day in a.m., mirtazapine 15 mg 1/2 tablet by mouth once a day, isosorbide mononitrate 1 tablet by mouth once daily, duloxetine 60 mg 1 capsule by mouth once daily.   REVIEW OF SYSTEMS:  CONSTITUTIONAL:  Denies any fever or fatigue.  Complaining of right-sided flank pain radiating to the right lower quadrant of the abdomen.  EYES:  Denies blurry vision, double vision.  Denies any glaucoma.  EARS, NOSE, THROAT:  Denies any tinnitus, epistaxis, discharge.  RESPIRATION:  Denies cough, COPD.  CARDIOVASCULAR:  No chest pain, palpitations, syncope.  GASTROINTESTINAL:  Complaining of nausea, vomiting, and diarrhea associated with right lower quadrant abdominal pain and right-sided flank pain.  Denies any hematemesis or melena.  GENITOURINARY:  No dysuria.  Diagnosed with renal calculi.  Denies any urinary frequency.  ENDOCRINE:  Denies polyuria, nocturia.  Has diet-controlled diabetes mellitus.  Denies any heat or cold intolerance.  HEMATOLOGIC AND LYMPHATIC:  No anemia, easy bruising, bleeding.  INTEGUMENTARY:  No acne, rash, lesions.  MUSCULOSKELETAL:  Denies any joint pain or back pain.  Denies any gout.  NEUROLOGIC:  Denies vertigo, ataxia, dementia.  Has Bell's palsy with left-sided facial droop.  PSYCHOSOCIAL HISTORY:  Has bipolar disorder and depression.   PHYSICAL EXAMINATION: VITAL SIGNS:  Temperature 98.4, pulse 76, respirations 16 to 18, blood pressure 156/91, pulse ox 98% on room  air.  GENERAL APPEARANCE:  Not under acute distress, but uncomfortable from right flank pain.  HEENT:  Normocephalic, atraumatic.  Pupils are equally reactive to light and accommodation.  No scleral icterus.  No conjunctival injection.  No sinus tenderness.  No postnasal drip.  Dry mucous membranes.  NECK:  Supple.  No JVD.  No thyromegaly.  Range of motion is intact.  LUNGS:  Clear to auscultation bilaterally.  No accessory muscle usage.  No anterior chest wall tenderness on palpation.  CARDIAC:  S1 and S2 normal.  Regular rate and rhythm.  No murmurs.  GASTROINTESTINAL:  Soft.  Bowel sounds are positive in all four quadrants.  Right lower quadrant abdominal tenderness is present.  Right flank tenderness is present.  No masses felt.  NEUROLOGIC:  Awake, alert, and oriented x 3.  Cranial nerves II through XII are grossly intact.  Motor and sensory are intact.  Reflexes are 2+.  Chronic Bell's palsy with left-sided facial droop.  Reflexes are 2+.  EXTREMITIES:  No edema.  No cyanosis.  No clubbing.  SKIN:  Warm to touch.  Normal turgor.  No rashes.  No lesions.  MUSCULOSKELETAL:  No joint effusion, tenderness or erythema. PSYCHIATRIC:  Normal mood and affect.  LABORATORY AND IMAGING STUDIES:  CAT scan of the abdomen and pelvis without contrast has revealed a 7 mm right UVJ stone with moderate to severe right-sided hydronephrosis and perinephric stranding.  Bilateral nephrolithiasis is present.  LFTs are normal.  WBC 10.5, hemoglobin is 12.7, hematocrit 38.9, platelets are 200.  Urinalysis clear in appearance.  Glucose is present.  Ketones are negative.  Blood 2+.  Nitrites and leukocyte esterase are negative.  Glucose 155, BUN 22, creatinine 1.52.  Sodium and potassium are normal.  Chloride 110, CO2 28.  GFR 46, anion gap is 5.  Serum osmolality and calcium are normal.  Lipase is normal.   ASSESSMENT AND PLAN:  A 69 year old Caucasian male presenting to the Emergency Department with a chief complaint  of right-sided flank pain radiating to the right lower quadrant of the abdomen associated with nausea, vomiting since yesterday evening will be admitted with the following assessment and plan.  1.  Acute right flank pain secondary to right-sided ureterovesical junction stone associated with severe hydronephrosis, leading to acute kidney injury.  We will keep him nothing by mouth, provide intravenous fluids.  I will provide morphine as needed for pain management.  Urology is consulted and notified by the Emergency Room physician.  He is aware and planning to do procedure of lithotripsy as soon as possible.  We will provide Foley catheter.  2.  Chronic history of congestive heart failure.  No exacerbation.  Continue diuretics  3.  Gastroesophageal reflux disease.  We will provide Pepcid intravenous while the patient is nothing by mouth.  4.  History of diabetes mellitus.  The patient is nothing by mouth.  We will provide insulin sliding scale.  5.  Hypertension.  The patient being nothing by mouth, we will provide intravenous Lopressor as needed for elevated blood pressure.  6.  Nicotine dependence.  Nicotine cessation  was advised and the patient will be provided with a nicotine patch while he is in the hospital.   Plan of care discussed with the patient.    CODE STATUS:  HE IS FULL CODE.  Wife is the medical power of attorney.   The patient will be transferred to Dr. Dareen PianoAnderson in a.m.   Total time spent is 50 minutes.    ____________________________ Ramonita LabAruna Jolana Runkles, MD ag:ea D: 08/25/2013 00:16:52 ET T: 08/25/2013 00:41:05 ET JOB#: [id number]  cc: Ramonita LabAruna Telina Kleckley, MD, <Dictator> Marya AmslerMarshall W. Dareen PianoAnderson, MD Ramonita LabARUNA Taran Haynesworth MD ELECTRONICALLY SIGNED 09/07/2013 2:07

## 2014-06-14 NOTE — Discharge Summary (Signed)
PATIENT NAME:  Paul Dean, Paul Dean MR#:  [id number] DATE OF BIRTH:  [birth year]  DATE OF ADMISSION:  08/24/2013 DATE OF DISCHARGE:  08/27/2013  DISCHARGE DIAGNOSES: 1.  Right nephrolithiasis with obstruction and right-sided hydronephrosis with acute renal failure from the obstruction.  2.  History of bipolar, likely with current mania, although mild and medications were not adjusted by psychiatry.  3.  History of congestive heart failure and edema, currently stable.   DISCHARGE MEDICATIONS: Per [facility] med reconciliation form. Basically, he will go back on his usual regimen. They will hold his Remeron at night as he is on trazodone and Cymbalta and again seems to be somewhat manic, per wife's description, with him staying up around-the-clock talking and quite excitable constantly.   HISTORY AND PHYSICAL: Please see detailed history and physical done on admission.   HOSPITAL COURSE: The patient was admitted with an obstructing stone. Urology saw the patient and put a stent in and retrieved the stone. Creatinine was up to 1.5, which is slightly above baseline. Was given IV fluids, which were then decreased given his propensity for edema as well. He was stable otherwise. Was helped by PT who recommended home health PT. Psychiatry did not recommend psychiatric inpatient admission. Wife was hesitant to take him home given his psychiatric status. She does note he has an appointment in 2 days with his usual psychiatrist to further adjust and care for his medication needs. She does understand she can bring him back to the Emergency Room for further psychiatric evaluation if she deems necessary at any time. She understands and agrees with this. Notably, urine culture was negative.  TIME SPENT: Approximately 35 minutes to do discharge tasks today.   ____________________________ Marya AmslerMarshall W. Dareen PianoAnderson, MD mwa:sb D: 08/27/2013 08:01:01 ET T: 08/27/2013 08:45:48 ET JOB#: [id number]  cc: Marya AmslerMarshall W. Dareen PianoAnderson, MD,  <Dictator> Lauro RegulusMARSHALL W Everli Rother MD ELECTRONICALLY SIGNED 08/27/2013 10:06

## 2014-06-14 NOTE — Consult Note (Signed)
Admit Diagnosis:   RT UVJ STONE WITH HYDRONEEHROSIS/AKI: Onset Date: 25-Aug-2013, Status: Active, Description: RT UVJ STONE WITH HYDRONEEHROSIS/AKI    renal calculi:    copd:    Diabetes:    Anxiety:    CHF:    GERD:    Bells Palsy:    bells palsy:    Alcohol Abuse:    Bipolar Disorder:    Diabetes:    Hypertension:    Depression:    Hiatal Hernia Repair:   Home Medications: Medication Instructions Status  DULoxetine 60 mg oral delayed release capsule 1 cap(s) orally once a day for depression. Active  simvastatin 40 mg oral tablet 0.5 tab(s) orally once a day (at bedtime) Active  traZODone 50 mg oral tablet 1 tab(s) orally once a day (at bedtime) Active  lisinopril 10 mg oral tablet 1 tab(s) orally once a day Active  torsemide 10 mg oral tablet 1 tab(s) orally once a day Active  cloNIDine 0.1 mg oral tablet  orally , As Needed Active  potassium chloride 10 mEq oral tablet, extended release 2 tab(s) orally once a day Active  QUEtiapine 50 mg oral tablet 2 tab(s) orally once a day (at bedtime) Active   Lab Results:  Hepatic:  04-Jul-15 21:21   Bilirubin, Total 0.5  Alkaline Phosphatase 85 (45-117 NOTE: New Reference Range 01/11/13)  SGPT (ALT) 19  SGOT (AST) 20  Total Protein, Serum 7.8  Albumin, Serum 3.9  Routine Chem:  04-Jul-15 21:21   BUN  22  Creatinine (comp)  1.52  Sodium, Serum 143  Potassium, Serum 4.2  Chloride, Serum  110  CO2, Serum 28  Calcium (Total), Serum 8.8  Anion Gap  5  Osmolality (calc) 291  eGFR (African American)  53  eGFR (Non-African American)  46 (eGFR values <71m/min/1.73 m2 may be an indication of chronic kidney disease (CKD). Calculated eGFR is useful in patients with stable renal function. The eGFR calculation will not be reliable in acutely ill patients when serum creatinine is changing rapidly. It is not useful in  patients on dialysis. The eGFR calculation may not be applicable to patients at the low and  high extremes of body sizes, pregnant women, and vegetarians.)  Lipase 185 (Result(s) reported on 24 Aug 2013 at 09:41PM.)  05-Jul-15 05:11   Glucose, Serum  106  BUN  19  Creatinine (comp)  1.54  Sodium, Serum 143  Potassium, Serum 3.7  Chloride, Serum  108  CO2, Serum 30  Calcium (Total), Serum 8.5  Anion Gap  5  Osmolality (calc) 288  eGFR (African American)  52  eGFR (Non-African American)  45 (eGFR values <61mmin/1.73 m2 may be an indication of chronic kidney disease (CKD). Calculated eGFR is useful in patients with stable renal function. The eGFR calculation will not be reliable in acutely ill patients when serum creatinine is changing rapidly. It is not useful in  patients on dialysis. The eGFR calculation may not be applicable to patients at the low and high extremes of body sizes, pregnant women, and vegetarians.)  Magnesium, Serum 2.3 (1.8-2.4 THERAPEUTIC RANGE: 4-7 mg/dL TOXIC: > 10 mg/dL  -----------------------)  Routine Hem:  04-Jul-15 21:21   WBC (CBC) 10.5  RBC (CBC)  3.90  Hemoglobin (CBC)  12.7  Hematocrit (CBC)  38.9  Platelet Count (CBC) 200 (Result(s) reported on 24 Aug 2013 at 09:54PM.)  MCV 100  MCH 32.5  MCHC 32.5  RDW 13.0  05-Jul-15 05:11   WBC (CBC) 10.3  RBC (CBC)  3.77  Hemoglobin (CBC)  12.7  Hematocrit (CBC)  37.6  Platelet Count (CBC) 176  MCV 100  MCH 33.8  MCHC 33.8  RDW 13.5  Neutrophil % 75.8  Lymphocyte % 14.5  Monocyte % 9.1  Eosinophil % 0.5  Basophil % 0.1  Neutrophil #  7.8  Lymphocyte # 1.5  Monocyte # 0.9  Eosinophil # 0.0  Basophil # 0.0 (Result(s) reported on 25 Aug 2013 at 05:54AM.)   Radiology Results:  Radiology Results: CT:    04-Jul-15 22:24, CT Abdomen Pelvis WO for Stone  CT Abdomen Pelvis WO for Stone  REASON FOR EXAM:    flank pain  COMMENTS:       PROCEDURE: CT  - CT ABDOMEN /PELVIS WO (STONE)  - Aug 24 2013 10:24PM     CLINICAL DATA:  Right flank pain.    EXAM:  CT ABDOMEN AND PELVIS  WITHOUT CONTRAST    TECHNIQUE:  Multidetector CT imaging of the abdomen and pelvis was performed  following the standard protocol without IV contrast.    COMPARISON:  05/10/2013  FINDINGS:  Moderate-sized hiatal hernia. Heart is normal size. Lung bases are  clear.    Moderate to severe right hydronephrosis and perinephric stranding  due to a 7 mm stone at the right ureterovesical junction. This stone  was previously in the right lower pole. Small nonobstructing stone  persists in the right lower pole. Multiple cysts within the left  kidney. Tiny punctate nonobstructing stone in the midpole of the  left kidney. No hydronephrosis on the left.    Liver, gallbladder, spleen, pancreas, adrenals are unremarkable.  Large and small bowel grossly unremarkable. Aorta is calcified, non  aneurysmal. No free fluid, free air or adenopathy.   IMPRESSION:  7 mm right UVJ stone with moderate to severe right hydronephrosis  and perinephric stranding.    Bilateral nephrolithiasis.      Electronically Signed    By: Rolm Baptise M.D.    On: 08/24/2013 22:57         Verified By: Raelyn Number, M.D.,    No Known Allergies:   Nursing Flowsheets: **Vital Signs.:   05-Jul-15 09:31  Vital Signs Type Routine  Temperature Temperature (F) 98.1  Celsius 36.7  Temperature Source oral  Pulse Pulse 74  Pulse source if not from Vital Sign Device per Telemetry Clerk  Respirations Respirations 18  Systolic BP Systolic BP 767  Diastolic BP (mmHg) Diastolic BP (mmHg) 88  Mean BP 112  Pulse Ox % Pulse Ox % 98  Pulse Ox Activity Level  At rest  Oxygen Delivery Room Air/ 21 %  Telemetry pattern Cardiac Rhythm Normal sinus rhythm; pattern reported by Telemetry Clerk; 74  *Intake and Output.:   05-Jul-15 09:24  Grand Totals Intake:   Output:  400    Net:  -400 99 Hr.:  -400  Urine ml     Out:  400    09:24  Urinary Method  Void; Urinal    General Aspect 69 year old man with a history of  congestive heart failure, GERD, hypertension, hyperlipidemia, diet-controlled diabetes mellitus, Bell???s palsy with left facial droop and bipolar disorder, presented overnight to the ED right-sided flank pain.   Present Illness Pain began 4pm yesterday, in the right flank, sharp and throbbing, 9/10, radiating to right abdomen, with nausea and vomiting x 2. No fevers/chills, no hematuria, no LUTS or UTI symptoms. CT Abd shows 25m right UVJ stone with hydronephrosis and perinephric stranding.   Case History  and Physical Exam:  Chief Complaint right flank pain   Family History Non-Contributory   HEENT PERLA   Neck/Nodes Supple   Chest/Lungs Clear   Breasts WNL   Cardiovascular No Murmurs or Gallops  Normal Sinus Rhythm   Abdomen Benign  Other  mild right CVAT   Genitalia WNL   Neurological Grossly WNL   Skin Warm    Impression 69 year old man with an obstructing 91m right UVJ stone, hydronephrosis and perinephric stranding with intractable pain.   Plan 1) Discussed risks/benefits in detail of placing a right ureteral stent to relieve the obstruction.  The stranding around his kidney is concerning for significant infection behind the stone, and for that reason, I would recommend decompression of the kidney with a stent prior to any definitive stone management.  Pt understands and wants to proceed ASAP. 2) NPO for Cystoscopy, right retrograde pyelogram and right ureteral stent placement today 3) Pain control 4) Antiemetics 5) IV Fluids   Electronic Signatures: KPrentiss Bells(MD)  (Signed 05-Jul-15 16:11)  Authored: Health Issues, Significant Events - History, Home Medications, Labs, Radiology Results, Allergies, Vital Signs, General Aspect/Present Illness, History and Physical Exam, Impression/Plan   Last Updated: 05-Jul-15 16:11 by KPrentiss Bells(MD)

## 2014-06-14 NOTE — Consult Note (Signed)
PATIENT NAME:  Paul Dean, Paul Dean MR#:  [id number] DATE OF BIRTH:  [birth year]  DATE OF CONSULTATION:  10/11/2013  REFERRING PHYSICIAN:   CONSULTING PHYSICIAN:  Audery Amel, MD  IDENTIFYING INFORMATION AND REASON FOR CONSULTATION: A 69 year old male with a history of chronic mental illness and physical complaints, who was sent here under involuntary commitment from [facility] because of threatening behavior.   HISTORY OF PRESENT ILLNESS: Information obtained from the patient and the chart. Commitment paper taken out at [facility] today says he has been making threatening statements, talking about hurting or killing people at home, making suicidal threats, generally acting more bizarre and agitated. The patient repeats to me Paul standard mantra, which is that he is going to kill Paul Dean because Paul Dean is such a lazy worthless person. He also talks about how he has been having suicidal thoughts. Paul mood has been more angry and upset. Sleep is poor. Appetite is alright. He said he has been taking all of Paul prescription medicine. Denies any new physical problems. He is walking continues to be impaired. Denies that he has been drinking beer or abusing any drugs. It is not really clear whether there has been any change from Paul baseline.   PAST PSYCHIATRIC HISTORY: Paul Dean has a long history of bipolar depression and probably some degree of cognitive impairment and has had multiple admissions. He has a fixed symptom over the last several times he has been seen here of talking about wanting to hurt or kill Paul Dean. This is probably largely an idle threat given how impaired physically Paul Dean is, but he gets pretty angry and agitated about it. He has been treated with medication for depression, as well as other mood stabilizing medicines in the past. He does have a history also of substance abuse and has been admitted with relapses into beer drinking even in the past year. Carries a  chronic diagnosis of bipolar disorder, depressed.   PAST MEDICAL HISTORY: Multiple medical problems. He has a great deal of difficulty walking, using either a wheelchair or a walker. Chronic back pain, COPD, high blood pressure, dyslipidemia, enlarged prostate.   SOCIAL HISTORY: Lives with Paul wife and her Dean through a different marriage. The patient does not work outside the home obviously and is disabled.   FAMILY HISTORY: None identified.   SUBSTANCE ABUSE HISTORY: A history of recurrent abuse of alcohol, does not seem like he has probably been drinking recently.   REVIEW OF SYSTEMS: Says that he is angry and irritable. Poor sleep. Vague about hallucinations. Endorses homicidal and suicidal thoughts, both rather vaguely. Chronic difficulty walking. Chronic difficulty urinating.   MENTAL STATUS EXAMINATION: Disheveled elderly gentleman who looks Paul stated age or older. Initially, he was hostile towards me, but quickly calmed down and was able to cooperate with the interview. Eye contact good. Psychomotor activity limited by Paul physical problems. Speech is decreased in total amount, a little bit quiet and kind of telegraphic. Affect irritable and labile. Mood stated as pissed off. Thoughts are disorganized, not grossly bizarre, but rambling, and somewhat paranoid. Endorses homicidal and suicidal ideation. Denies auditory or visual hallucinations. He is alert and oriented x 4. He could remember 2/3 objects at 3 minutes. Baseline intelligence, probably low-average, some degree of cognitive impairment chronically from Paul illness.   PHYSICAL EXAMINATION: GENERAL: As he has looked on other occasions recently, he seems to be having some contractures. He does not seem to be able to walk independently.  He is curled up in bed. He has a lot of bruises on him, which is chronic. Poor hygiene. He has a Bell's palsy with weakness on one side of Paul face.  MOST RECENT VITAL SIGNS: Include a blood pressure  of 138/76, respirations 20, pulse 83, temperature 98.5.   LABORATORY RESULTS: Urinalysis noninfected. Drug screen all negative. Salicylates and acetaminophen negative. Alcohol negative. Chemistry panel: He had an elevated creatinine at 1.6, elevated chloride 109. CBC: Chronic anemia, hematocrit 33.5. Urinalysis positive for protein. Alcohol negative.   ASSESSMENT: A 69 year old man with bipolar disorder, depressed, chronic mood problems. He has once again presented through [facility] where he has been making hostile threatening statements. From my experience with him this is pretty typical for him and it is not clear to me whether there is a change really from Paul baseline, but there is concern about Paul threats. It had seemed to me, and the patient seems to see it this way as well, that he functioned better when he was at [facility] or another living facility rather than going back home with Paul family, but here he is back home with them and once again having these same threatening paranoid thoughts. Needs admission for safety.   TREATMENT PLAN: Admit to psychiatry. Continue current prescription medicines. Fall precautions, close precautions in place. Get a physical therapy consult. Try and contact the family for further collateral information about whether he is safe to come back home.   CURRENT MEDICATIONS: Cymbalta 60 mg per day, potassium chloride 60 mEq per day, trazodone 50 mg at night, lisinopril 10 mg per day, torsemide 10 mg a day, simvastatin 20 mg at night, albuterol ipratropium inhaler 3 mL 4 times a day, carvedilol 6.25 mg once a day, hyoscyamine combination pill 1 p.r.n. 4 times a day for dysuria.   ALLERGIES: No known drug allergies.   DIAGNOSIS, PRINCIPAL AND PRIMARY:  AXIS I: Bipolar disorder, depressed.   SECONDARY DIAGNOSES: AXIS I: Deferred.  AXIS II: Deferred.  AXIS III: Ataxia, Bell's palsy, dyslipidemia, history of kidney stones recently, high blood pressure, chronic  obstructive pulmonary disease.  AXIS IV: Severe.  AXIS V: Functioning at time of evaluation 35.   ____________________________ Audery AmelJohn T. Clapacs, MD jtc:at D: 10/11/2013 18:18:20 ET T: 10/11/2013 18:35:27 ET JOB#: [id number]  cc: Audery AmelJohn T. Clapacs, MD, <Dictator> Audery AmelJOHN T CLAPACS MD ELECTRONICALLY SIGNED 11/01/2013 16:58

## 2014-06-14 NOTE — Op Note (Signed)
PATIENT NAME:  Paul Dean, Paul Dean MR#:  [id number] DATE OF BIRTH:  1945-03-03  DATE OF PROCEDURE:  09/30/2013  PREOPERATIVE DIAGNOSIS: Right ureteral stent.   POSTOPERATIVE DIAGNOSIS:  Right ureteral stent.   PROCEDURES: Cystoscopy with removal of right ureteral stent.   SURGEON:  Orson ApeMichael R Krisha Beegle, MD, and Ronalee BeltsBhandari   ANESTHETIC METHOD:  General per  Ronalee BeltsBhandari, and local per Dr. Orson ApeMichael R Hansini Clodfelter, MD   INDICATIONS: See the dictated history and physical. After informed consent, the patient requests the above procedure.   OPERATIVE SUMMARY: After adequate general anesthesia had been obtained, the patient was placed into dorsal lithotomy position, and the perineum was prepped, and draped in the usual fashion; the 21 French cystoscope was coupled with the camera, and then visually advanced into the bladder. The bladder was thoroughly inspected. No bladder tumors were identified. The bladder was moderately trabeculated. Prostatic urethral length was 4 cm, right orifice had stent present. The left orifice had clear efflux. The stent was then engaged with the alligator forceps, and removed. Then 10 mL of viscous Xylocaine was instilled within the urethra, and the bladder. B and O suppository was placed.   The procedure was then terminated, and patient was transferred to the recovery room in stable condition.    ____________________________ Suszanne ConnersMichael R. Evelene CroonWolff, MD mrw:nt D: 09/30/2013 15:06:04 ET T: 09/30/2013 22:41:15 ET JOB#: [id number]  cc: Suszanne ConnersMichael R. Evelene CroonWolff, MD, <Dictator> Orson ApeMICHAEL R Brysin Towery MD ELECTRONICALLY SIGNED 10/01/2013 11:47

## 2014-06-14 NOTE — Consult Note (Signed)
Brief Consult Note: Diagnosis: bipolar disorder depressed.   Patient was seen by consultant.   Consult note dictated.   Recommend further assessment or treatment.   Orders entered.   Comments: Psychiatry: Patient just recently discharged from geropsychiatry unit. He has been stable on a combination of Seroquel, Cymbalta and Remeron. He is not suicidal or homicidal.   PLAN: 1. He does not need psychiatric admission. Please discharge as appropriate.  2. We will continue current medications.  3. I will sign off.  Electronic Signatures: Kristine LineaPucilowska, Phat Dalton (MD)  (Signed 06-Jul-15 19:14)  Authored: Brief Consult Note   Last Updated: 06-Jul-15 19:14 by Kristine LineaPucilowska, Veida Spira (MD)

## 2014-06-14 NOTE — Consult Note (Signed)
PATIENT NAME:  Paul Dean, Paul Dean MR#:  [id number] DATE OF BIRTH:  Nov 09, 1945  DATE OF CONSULTATION:  10/12/2013  REFERRING PHYSICIAN:   CONSULTING PHYSICIAN:  Deondrae Mcgrail K. Umberto Pavek, MD  AGE: 69 years.   SEX: Male.   RACE: White.   SUBJECTIVE: The patient was seen in consultation in the [facility] [place]. 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 He is married for the second time for over 20 years and lives with his second wife who is 71 years old, along with a step-daughter who owns the trailer, is in her 3250s, and a grandson who is in his 30s.   CHIEF COMPLAINT: "They do not want me. I have no place to go. I cannot go back."  According to information obtained from the chart, the patient stated, "Might as well gone and died." It was because he was frustrated about his living situation as the step-daughter and grandson and wife do not want him.   PAST PSYCHIATRIC HISTORY: A history of inpatient psychiatry on many occasions for depression. History of suicide attempts on quite a few occasions. The patient admits that he is being [facility] by Dr. Toni Amendlapacs.   ALCOHOL AND DRUGS: Denied.   MENTAL STATUS: The patient alert and oriented to place, person, and time. Fully aware of situation that [facility]. Affect is flat. Mood restricted, depressed, feels hopeless and helpless. He wishes that he was gone and dead because he has no place to go and people that he lives with do not want him, and he is tired of going to hospitals. No psychosis. Does not appear to be responding to internal stimuli. Denies auditory or visual hallucinations. Cognition intact. Does admit to suicidal wishes and thoughts, but no suicidal plans, and contracts for safety. Insight and judgment guarded. Impulse control poor.   IMPRESSION: Major depressive disorder recurrent with suicidal ideas and wishes, but contracts for safety. Recommend  inpatient hospital psychiatry for close observation and management so that his medications can be adjusted, and social services can find proper placement.       ____________________________ Jannet MantisSurya K. Guss Bundehalla, MD skc:at D: 10/12/2013 17:15:35 ET T: 10/12/2013 17:51:22 ET JOB#: [id number]  cc: Monika SalkSurya K. Guss Bundehalla, MD, <Dictator> Beau FannySURYA K Darci Lykins MD ELECTRONICALLY SIGNED 10/13/2013 18:49

## 2014-06-15 NOTE — H&P (Signed)
PATIENT NAME:  Paul Dean, Paul Dean MR#:  [id number] [place] BIRTH:  March 02, 1945  DATE OF ADMISSION:  07/13/2011  PRIMARY CARE PHYSICIAN: Einar CrowMarshall Anderson, MD  REFERRING PHYSICIAN: Governor Rooksebecca Lord, MD  CHIEF COMPLAINT: Dysuria.   HISTORY OF PRESENT ILLNESS: 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 The patient is a poor historian and presents with complaints of dysuria. The patient had a urinalysis done which came back negative, but the patient was found to be febrile and hypoxic as well. The patient's T-max was 102.3 and desaturated to the high 80s on room air. The patient was started on oxygen. The patient had chest x-ray done which did not show evidence of bilateral congestive heart failure picture. As well there was a questionable right mid lobe infiltrate. The patient was started on IV Rocephin and Zithromax for pneumonia. The patient has history of Bell's palsy with left facial droop which remains at his baseline. The patient denies any chest pain. Complains of mild shortness of breath. As well complains of cough, nonproductive As well the patient has been complaining off generalized weakness but denies any nausea, vomiting, diarrhea, or abdominal pain; no chest pain or chills.   PAST MEDICAL HISTORY:  1. Hypertension.  2. Type 2 diabetes.  3. Major depression with severe psychiatric disease.  4. Chronic renal insufficiency.  5. Gastroesophageal reflux disease.  6. History of chronic obstructive pulmonary disease, but the patient is not on home oxygen.  7. Sleep apnea cannot tolerate BiPAP.  8. Hyperlipidemia.  9. History of Bell's palsy with residual facial weakness.   ALLERGIES: No known drug allergies.   HOME MEDICATIONS:  1. Vitamin B12 one injection  monthly.  2. Cymbalta EC 30 mg twice a day.  3. Depakote ER 250 mg at bedtime.  4. Glipizide 5 mg daily.  5. Lisinopril 20 mg oral daily.  6. Prilosec 20 mg oral daily.  7. Remeron 45 mg at bedtime.  8. Simvastatin 40 mg daily.  9. Trazodone 150 mg oral daily.  10. Clonazepam 0.5 mg oral at bedtime.  11. Potassium ER 20 milliequivalents p.o. twice a day.  12. Abilify 15 mg oral daily.  13. Enalapril 20 mg oral daily.   FAMILY HISTORY: Negative for diabetes, hypertension, or coronary artery disease.  SOCIAL HISTORY: The patient reports he smokes cigarettes occasionally. No alcohol or illicit drug use.   REVIEW OF SYSTEMS: CONSTITUTIONAL: The patient complains of weakness and fatigue. Denies chills, but he was afebrile in the ED. EYES: Denies any blurry vision, double vision, or pain. ENT: Denies tinnitus, ear ache, nasal discharge, or sore throat. RESPIRATORY: Has complaints of mild shortness of breath. Complaint of cough, dry and nonproductive. CARDIOVASCULAR: Denies any chest pain, palpitations. Has lower extremity edema, which reports is recent. GASTROINTESTINAL: Denies any nausea, vomiting, diarrhea, constipation, or abdominal pain. GENITOURINARY: Complains of dysuria. Denies any hematuria. The patient basically says he cannot "pee".  ENDOCRINE: Denies any polyuria, polydipsia, or heat or cold intolerance. HEMATOLOGY: Denies any easy bruising or bleeding. MUSCULOSKELETAL: Denies any joint pain. NEUROLOGIC: Denies any headaches, numbness, tingling, or tremors. Only complains of generalized weakness. PSYCHIATRIC: The patient has major depression and psychiatric disorder.   PHYSICAL EXAMINATION:   VITAL SIGNS: Temperature 100.3, T-max 102.6, pulse 92, respiratory rate 36, blood pressure 139/78, and saturating 95% on 3 liters nasal cannula.  GENERAL: Well-nourished male who is comfortable and in no apparent distress.   HEENT: Head atraumatic, normocephalic. Left side facial palsy,  reportedly this is his baseline. Pink conjunctivae. Anicteric sclerae. Moist oral mucosa.   NECK: Supple. No thyromegaly. No JVD. Denies any stiffness. The patient has negative meningeal signs. No cervical tenderness.  CHEST: The patient has scattered wheezing and rales.   CHEST: Good air entry.   CARDIOVASCULAR: S1 and S2 heard. No rubs, murmurs, or gallops.   ABDOMEN: Soft, nontender, and nondistended. Bowel sounds present.   EXTREMITIES: +2 bilateral edema.   PSYCHIATRIC: The patient appears to be sluggish. Awake and alert x2.   NEUROLOGICAL: Has left side facial palsy. Motor 5/5 but generally weak. Deep tendon reflexes symmetrical and intact.   PERTINENT LABS/STUDIES: Glucose 123, BUN 24, creatinine 1.39, sodium 141, potassium 3.4, chloride 103, CO2 29, and anion gap 9. White blood cells 6.5, hemoglobin 13.6, hematocrit 41.8, and platelets 150.   Urinalysis negative.   Chest x-ray: The patient has increased interstitial markings suggesting congestive heart failure picture. There is no evidence of pleural effusion.   ASSESSMENT AND PLAN:  1. Urinary retention - the patient has evidence of urinary retention and will be started on Flomax, has Foley inserted.  2. Pneumonia - the patient has questionable evidence of right lung infiltrate, but given the fact he is hypoxic and febrile we will start him on IV Rocephin and Zithromax. Blood cultures were already sent. 3. Congestive heart failure - the patient does not have any documented evidence of congestive heart failure, but the patient has evidence of volume overload on chest x-ray and bilateral lower extremity edema. We will check 2-D echo on the patient and we will check pro-BNP in the a.m. and we will start the patient on Lasix 20 mg p.o. twice a day.  4. 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 The patient is on multiple psych medications, SSRI and SNRI  and other antidepressants. Upon presentation because the patient was febrile with mild altered mental status, the diagnosis of malignant neuroleptic hyperthermia and seroton in syn attempted initially, but physical examination did go with that, but we will hold his psych medications for now, mainly the Cymbalta, Remeron, trazodone, and Abilify and we will have Psych evaluate the patient in a.m. and readjust these medications if needed.  5. Diabetes - we will hold oral hyperglycemic agents. We have the patient on sliding scale.  6. Hyperlipidemia - I will continue on simvastatin.  7. Hypertension - we will continue the patient on lisinopril.   TOTAL TIME SPENT ON PATIENT CARE: 55 minutes.  ____________________________ Starleen Arms, MD dse:slb D: 07/13/2011 03:50:12 ET T: 07/13/2011 08:33:15 ET JOB#: [id number]  cc: Starleen Arms, MD, <Dictator> Marya Amsler. Dareen Piano, MD Jasani Lengel Teena Irani MD ELECTRONICALLY SIGNED 07/29/2011 0:46

## 2014-06-15 NOTE — Discharge Summary (Signed)
PATIENT NAME:  Paul Dean, Paul Dean MR#:  [id number] DATE OF BIRTH:  [birth year]  DATE OF ADMISSION:  07/13/2011 DATE OF DISCHARGE:  07/16/2011  DISCHARGE DIAGNOSES:  1. Urinary tract infection with urinary retention, likely prostatitis.  2. Sepsis from confusion, tachycardia, and above.  3. Chronic obstructive pulmonary disease with flare, wheezing slightly more.  4. History of severe psychiatric disease.  5. History of hypertension.  6. History of severe edema, controlled presently.  DISCHARGE MEDICATIONS:  1. Levaquin 500 mg daily for 10 days for prostatitis.  2. Prednisone taper 40 mg a day for five days, 20 mg for five days, then discontinue.  3. Advair 1 puff b.i.d.  4. Spiriva 1 puff daily.  5. Simvastatin 80 mg daily.  6. Abilify 15 mg daily.  7. Depakote at its usual dose, 875 mg at bedtime.  8. Mirtazapine 45 mg daily.  9. Omeprazole 20 mg daily.  10. Trazodone 150 mg daily.  11. Artane 2 mg daily.  12. Potassium chloride 20 mEq b.i.d.  13. Enalapril 20 mg daily.  14. Torsemide 20 mg daily, which is his usual regimen and he does well with.   HISTORY AND PHYSICAL: Please see detailed History and Physical done on admission.   HOSPITAL COURSE: The patient was admitted with urinary retention, Foley catheter, likely urinary tract infection with worsening confusion and tachycardia. He was started on Rocephin and ultimately switched to Levaquin. Blood cultures had coag negative staph only, was likely a contaminant in one. The other was Gram variable coccobacilli which again is likely a contaminant, growing out extremely late. He improved on above. His fever is down. His mental status improved to baseline.  He did have a low free valproic acid level on his usual regimen at home. Inhalers did help his wheezing though he is still wheezing some so a prednisone taper will be started. His chest x-rays were negative for infiltrate; no clear signs of pneumonia were noted. He did have 8 RBCs, 4 WBCs  in his urinalysis. He will be seen in followup soon. We will get Home Health to follow him closely. He has an [facility] team that sees him as well.   TIME SPENT ON DISCHARGE:  Approximately 35 minutes.   ____________________________ Marya AmslerMarshall W. Dareen PianoAnderson, MD mwa:bjt D: 07/16/2011 10:45:19 ET T: 07/18/2011 11:22:59 ET JOB#: [id number]  cc: Marya AmslerMarshall W. Dareen PianoAnderson, MD, <Dictator> Lauro RegulusMARSHALL W ANDERSON MD ELECTRONICALLY SIGNED 07/18/2011 12:28

## 2014-06-15 NOTE — Consult Note (Signed)
PATIENT NAME:  Paul Dean, Paul Dean MR#:  [id number] DATE OF BIRTH:  03/21/1945  DATE OF CONSULTATION:  07/13/2011  REFERRING PHYSICIAN:  Huey Bienenstock, MD  CONSULTING PHYSICIAN:  Doralee Albino. Maryruth Bun, MD  REASON FOR CONSULTATION: Psychotropic medication management.   HISTORY OF PRESENT ILLNESS: Paul Dean is a 69 year old married Caucasian male with a prior diagnosis of bipolar disorder with psychotic features followed [facility] as well as multiple medical problems including Bell's palsy, diabetes, hypertension, and chronic renal insufficiency who was admitted to the Medicine service with complaints of dysuria. Psychiatry was consulted as admitting physician wanted to rule out neuroleptic malignant syndrome. During the interview the patient was somewhat lethargic. He was oriented to place and gave the month as May and year as 2013. He had a difficult time coming up with the day of the month and day of the year. Speech was slow and soft but fluent and coherent. Again, thought processes were slowed. He did not appear to be actively psychotic or responding to internal stimuli. He denied feeling depressed and denied any psychotic symptoms such as auditory or visual hallucinations. The patient denied any recent changes in psychotropic medications and denied any major mood swings including hypomanic or manic symptoms over the past several months. The only psychiatric complaint that he did have was difficulty with insomnia. Collateral information from the patient's grandson as well as prior records from [facility] indicate that the patient's mood had been stable and he had not had any recent psychotropic medication changes. He had struggled with depression back in March but within the past 1 to 2 months his family reports that his mood had been better. He does participate in a [facility] and has been going there 4 to 5 days a week.   The patient was admitted to the Medicine service with  fever, hypoxia, and complaints of dysuria. There is a questionable mid-lobe infiltrate on chest x-ray. The admitting medicine physician was suspicious of neuroleptic malignant syndrome secondary to fever. CK was 345. Ammonia level was 42. There was no elevation of white blood cell count and no rigidity on exam.   PAST PSYCHIATRIC HISTORY: The patient has had multiple prior inpatient psychiatric hospitalizations beginning at the age of 71 at [facility] at least twice, [facility] and [facility] twice. He is currently on a combination of Depakote, Cymbalta, Abilify, Klonopin, and Remeron as well as trazodone. The patient does have a history of overdosing on his medications in the past when going through a divorce from his first wife. He also has a history of suicidal thoughts multiple times in the past. No history of any substance abuse treatment.   FAMILY PSYCHIATRIC HISTORY: The patient has a father who was an alcoholic.   PAST MEDICAL HISTORY:  1. Hypertension.  2. Insulin-dependent diabetes mellitus. 3. Chronic renal insufficiency. 4. Gastroesophageal reflux disease.   5. History of chronic obstructive pulmonary disease.  6. Sleep apnea but cannot tolerate BiPAP.  7. Hyperlipidemia.  8. History of Bell's palsy with residual facial weakness.   ALLERGIES: No known drug allergies.  SUBSTANCE ABUSE HISTORY: The patient does have a history of binge drinking alcohol in the past but currently only drinks once a week two beers each time. He denies any history of any DTs or alcohol withdrawal seizures. He denies any history of any cocaine, cannabis, opiate, or stimulant use. He currently smokes half a pack of cigarettes per day and has been smoking since his mid  20's.   SOCIAL HISTORY: The patient was raised by both his biological parents in the [place] area. Both parents are currently deceased. He denies any history of any physical or sexual abuse. Per prior records, he has a 6th grade education and  never obtained his GED. He worked at [employer] in [place] in the past and then was placed on disability for bipolar disorder. He has been married twice in the past and currently lives with his wife in the [place] area.   LEGAL HISTORY: He does have a history of getting into trouble for pulling a fire alarm outside of a building in the past. No jail time. He denies any current pending charges.   MENTAL STATUS EXAM: Paul Dean is a 69 year old Caucasian male who is lying in his hospital bed. He was somewhat lethargic. Speech was slow and soft but fluent and coherent. He was alert and oriented to place and situation. He knew the month and the year but had a difficult time coming up with the day of the month and day of the week. Mood was described as being "okay" but affect was flat. Thought processes were slowed but logical and goal directed. He denied any current suicidal or homicidal thoughts. He denied any current auditory or visual hallucinations. He denied any paranoid thoughts or delusions. He did not appear to be actively psychotic or responding to any internal stimuli. He could come up with the current president as Obama but had difficulty naming prior presidents. He was unable to spell world forwards or backwards correctly. Recall was 3 out of 3 initially and 0 out of 3 after five minutes. Abstraction was concrete. He could do simple calculations but had a difficult time with serial sevens or serial threes.   SUICIDE RISK ASSESSMENT: At this time the patient does have some confusion and altered mental status. He denies any current suicidal intent and has not engaged in any behavior to try to harm himself. However, if confusion worsens his risk of harm to self and others will be elevated.   REVIEW OF SYSTEMS: The patient does complain of weakness, fatigue, fever, chills, and night sweats in the past one week. He also complains of decreased energy level. He also complains of headache and dizziness.  HEENT: Complains of headache and dizziness. EYES: He denies any diplopia or blurred vision. ENT: He denies any difficulty swallowing, neck pain or throat pain. RESPIRATORY: He does complain of cough and shortness of breath. CARDIOVASCULAR: He denies any chest pain or palpitations. He denies any syncopal episodes. GI: He denies any nausea, vomiting, or abdominal pain. He denies any change in bowel movements. GU: He does complain of dysuria over the past 1 to 2 weeks. MUSCULOSKELETAL: He denies any muscle aches or pain. He denies any joint swelling. NEUROLOGIC: He denies any difficulty with his gait. He denies any numbness or tingling. PSYCHIATRIC: Please see history of present illness.  LABORATORY, DIAGNOSTIC, AND RADIOLOGICAL DATA:  Sodium 141, potassium 3.3, chloride 102, CO2 30, BUN 23, creatinine 1.32, glucose 108. Ammonia level 42. Valproic acid level 22. CK 345. White blood cell count 5.4, hemoglobin 13.6, platelet count 152. Blood cultures and urine cultures show no growth.    PHYSICAL EXAMINATION:   VITAL SIGNS: Blood pressure 145/79, heart rate 93, respirations 18, temperature 99.9. Please see initial physical exam as completed by admitting physician, Dr. Huey Bienenstockawood Elgergawy.    DIAGNOSES:  AXIS I:  1. Bipolar disorder with history of psychotic features.  2. History of binge  drinking and alcohol abuse, in full remission.   AXIS II: Borderline intellectual functioning.   AXIS III:  1. Hypertension.  2. History of cerebral meningioma. 3. Chronic renal insufficiency. 4. Type II diabetes. 5. Prostatic hypertrophy. 6. History of Bell's palsy.   AXIS IV: Moderate to severe. Chronic mental illness, on disability.   AXIS V: GAF at present equals 20.   ASSESSMENT AND TREATMENT RECOMMENDATIONS: Paul Dean is a 69 year old married Caucasian male with a history of bipolar disorder with psychotic features and borderline intellectual functioning per prior records who was admitted to the Medicine  service with dysuria, fever, and altered mental status. He has questionable pneumonia as well. Psychiatry was consulted to rule out neuroleptic malignant syndrome. The patient does not have an elevated white blood cell count, any severe rigidity, or elevation in CK above 1000 which are typically present with a diagnosis of neuroleptic malignant syndrome. The patient has been stable on these medications for quite some time. He does, however, have an elevation of ammonia at 42 and will go ahead and suspend Depakote for now in case hyperammonia may be contributing to altered mental status. Will plan to hold Abilify for the next day or so in case the patient is having a resolving NMS. Will continue Remeron and Cymbalta at current dosages for now. Will recheck CK in a.m. as well as ammonia level. Again, the patient's clinical presentation is not highly suggestive of a neuroleptic malignant syndrome. He will need to follow-up with [facility] at the time of discharge.  ____________________________ Doralee Albino. Maryruth Bun, MD akk:drc D: 07/13/2011 12:51:49 ET T: 07/13/2011 13:29:12 ET JOB#: [id number]  cc: Aarti K. Maryruth Bun, MD, <Dictator> Darliss Ridgel MD ELECTRONICALLY SIGNED 07/16/2011 21:37

## 2014-06-22 NOTE — Consult Note (Addendum)
PATIENT NAME:  Paul Dean, Paul Dean MR#:  [id number] DATE OF BIRTH:  10-31-1945  DATE OF CONSULTATION:  06/11/2014.  CONSULTING PHYSICIAN:  Audery Amel, MD  IDENTIFYING INFORMATION AND REASON FOR CONSULTATION:  A 69 year old man with a history of bipolar disorder who was brought in under involuntary commitment because he had refused to cooperate at the assisted living facility where he is staying and had told them that he was going to slit his throat.   CHIEF COMPLAINT:  "I ain't going back to [facility]."   HISTORY OF PRESENT ILLNESS:  Information from the patient and the chart.  All we have as far as referral paperwork is the commitment petition which states that he had refused to cooperate with staff, had walked away from them, and had said that he might as well die and was going to cut his throat.  The patient confirms all of this.  He also tells me that recently his mood has been more irritable, although he cannot tell me exactly for how long.  He says that he sleeps terribly.  Paul Dean is not a particularly good historian since he is insistent on going back to repeating his several complaints over and over again.  He wants to talk about how the assisted living facility steals his money, how he hates his wife, how he hates his wife's grandson, Paul Dean.  He tells me in no uncertain terms that he refuses to go back to where he was living and that he has no place to go in the world and wishes that he were dead because nobody wants him. He says he has been compliant with all the medicines they give him.  Denies that he has been drinking or using drugs.   PAST PSYCHIATRIC HISTORY:  A 69 year old man has a long history of bipolar disorder. Multiple hospitalizations going back many years.  In the past has had a problem with noncompliance at times.  He has a positive history of past suicide attempts and aggression.  He has become more physically disabled recently to the point where it is more difficult for  him to carry through on it.   SOCIAL HISTORY:  He is currently living in an assisted living facility because his wife was no longer able to manage him at home.  His behavior and his opposition to her got out of control. He obviously hates it, and as with many paranoid or demented people, he interprets the fact that all his money goes to paying his bill there as being that they are robbing him.   PAST MEDICAL HISTORY:  High blood pressure, dyslipidemia, history of arthritis.   FAMILY HISTORY:  Unknown at this time.   SUBSTANCE ABUSE HISTORY:  The patient tells me that he used to drink a lot of beer.  I do not recall right now whether he may have had an actual problem with drinking in the past or not.   Not drinking currently.  Not using any other drugs.   CURRENT MEDICATIONS:  They did not send over an active list, so I am going on the basis of what we have as an old medicine reconciliation as being lisinopril 10 mg a day, Cymbalta 60 mg once a day, trazodone 100 mg at night, Lipitor 40 mg at night, quetiapine 200 mg at night and 100 mg twice a day, carvedilol 6.25 mg once a day, torsemide 10 mg a day, Klor-Con extended release 20 mEq a day.   ALLERGIES:  No  known allergies.   REVIEW OF SYSTEMS:  The patient complains of pain all over his body, especially in his legs. Mood is angry.  Denies hallucinations.  No other specific physical complaints.   MENTAL STATUS EXAMINATION:  Chronically ill-looking gentleman, looks his stated age.  He was not very cooperative with the interview because he tends to dominate it with his own complaints rather than answering questions.  Eye contact:  Poor.  Psychomotor activity was slowed by the severity of his arthritis but he waves his arms a lot.  He shouts a lot and has loud pressured speech.  Affect is angry, irritable, and labile.  Mood is stated as being very bad. Thoughts are disorganized, paranoid, repetitious, flight of ideas.  Endorses suicidal ideation. Denies  homicidal ideation.  Denies hallucinations.  He is alert and oriented x 4.  He can repeat three words immediately, remembers two of them at three minutes.  His judgment and insight are chronically impaired.  His fund of knowledge and probably baseline intelligence are probably normal.   LABORATORY RESULTS:  Salicylates, acetaminophen and alcohol:  All negative.  Chemistry panel:  Low ALT at 12, creatinine elevated at 1.34, BUN elevated at 25, glucose 124.  Hematocrit low at 35, hemoglobin low at 11.5.   VITAL SIGNS:  Blood pressure 159/96, respirations 20, pulse 85, temperature 97.   If I am not mistaken, I believe that his primary care doctor, Dr. Dareen PianoAnderson, always wants to maintain Paul Dean's blood pressure probably slightly higher than average because of Paul Dean's history of orthostasis and strokes that might be related to dropping his blood pressure.   ASSESSMENT:  A 69 year old man with bipolar disorder, history of strokes, history of arthritis, history of high blood pressure.  He presents with a constellation of symptoms that seems like mania or mixed bipolar to me.  Clearly unmanageable back at his group home.  Needs hospital level treatment.   TREATMENT PLAN:  I have requested referral to a geriatric psychiatry unit.  Hopefully we can get whatever his corrected current medicines are.  In the meantime, I will go with what he was taking on our last reconciliation.  Tried to do some supportive counseling with the patient and I think that I helped to calm him down slightly.   DIAGNOSIS, PRINCIPAL AND PRIMARY:  AXIS I:  Bipolar disorder, type I, mixed episode.   SECONDARY DIAGNOSES:  AXIS I:  No further.  AXIS II:  Deferred.  AXIS III:  History of strokes, arthritis, high blood pressure.     ____________________________ Audery AmelJohn T. Clapacs, MD [id number] D: 06/11/2014 18:02:46 ET T: 06/11/2014 18:25:12 ET JOB#: [id number]  cc: Audery AmelJohn T. Clapacs, MD, <Dictator> Audery AmelJOHN T CLAPACS MD ELECTRONICALLY  SIGNED 06/27/2014 19:29

## 2014-12-10 ENCOUNTER — Other Ambulatory Visit
Admission: RE | Admit: 2014-12-10 | Discharge: 2014-12-10 | Disposition: A | Discharge status: Home / Self Care | Payer: Medicare Other | Source: Ambulatory Visit | Attending: Internal Medicine | Admitting: Internal Medicine

## 2014-12-10 DIAGNOSIS — F29 Unspecified psychosis not due to a substance or known physiological condition: Secondary | ICD-10-CM | POA: Insufficient documentation

## 2014-12-10 DIAGNOSIS — F329 Major depressive disorder, single episode, unspecified: Secondary | ICD-10-CM | POA: Insufficient documentation

## 2014-12-10 LAB — LITHIUM LEVEL: LITHIUM LVL: 0.32 mmol/L — AB (ref 0.60–1.20)

## 2015-02-01 IMAGING — CR DG CHEST 1V PORT
1 series · 2 of 2 positions shown · non-contrast
Comparison: none

REASON FOR EXAM: Chest pain
COMMENTS:

PROCEDURE:     DXR - DXR PORTABLE CHEST SINGLE VIEW  - September 30, 2012  [DATE]
RESULT:     The lungs are borderline hypoinflated. There is no focal
infiltrate. There is no pneumothorax or pneumomediastinum. The cardiac
silhouette is top normal in size. The pulmonary vascularity is not engorged.

[Series 1: ap · 0.17mm/px · 2 of 2 slices shown]
[im 1/2]
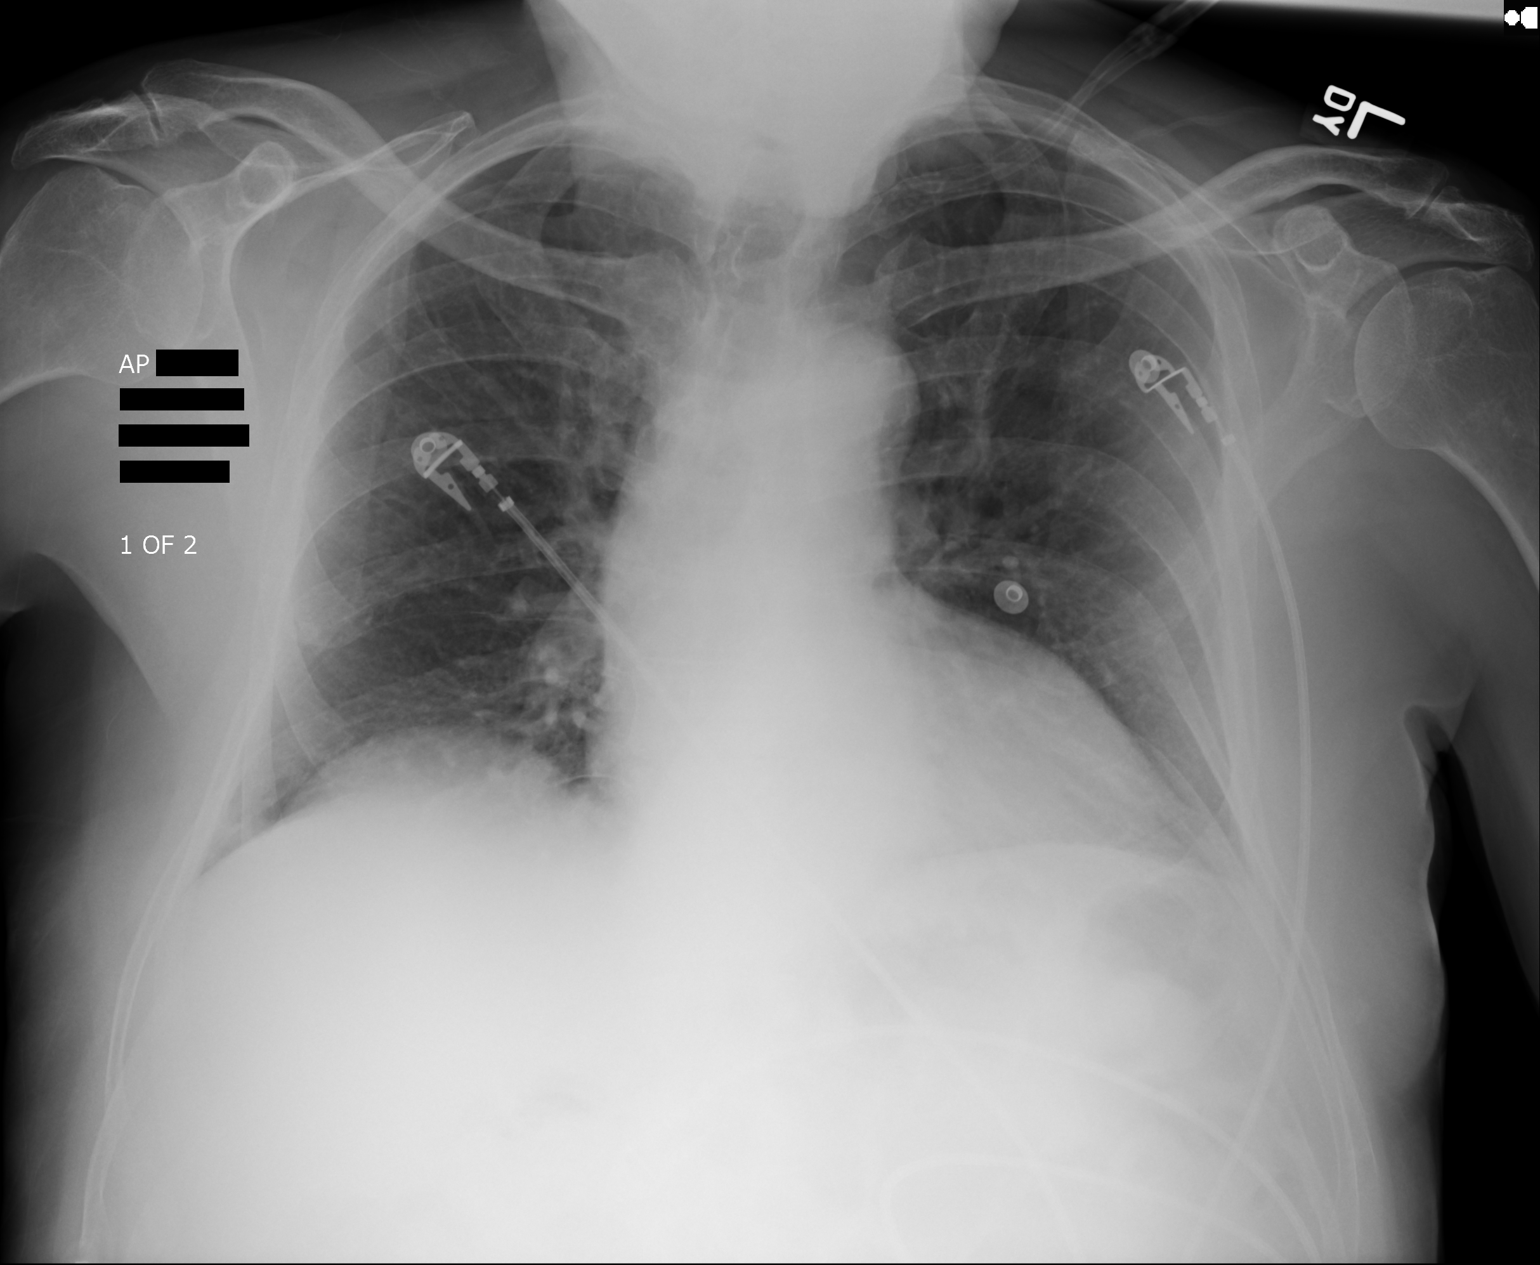
[im 2/2]
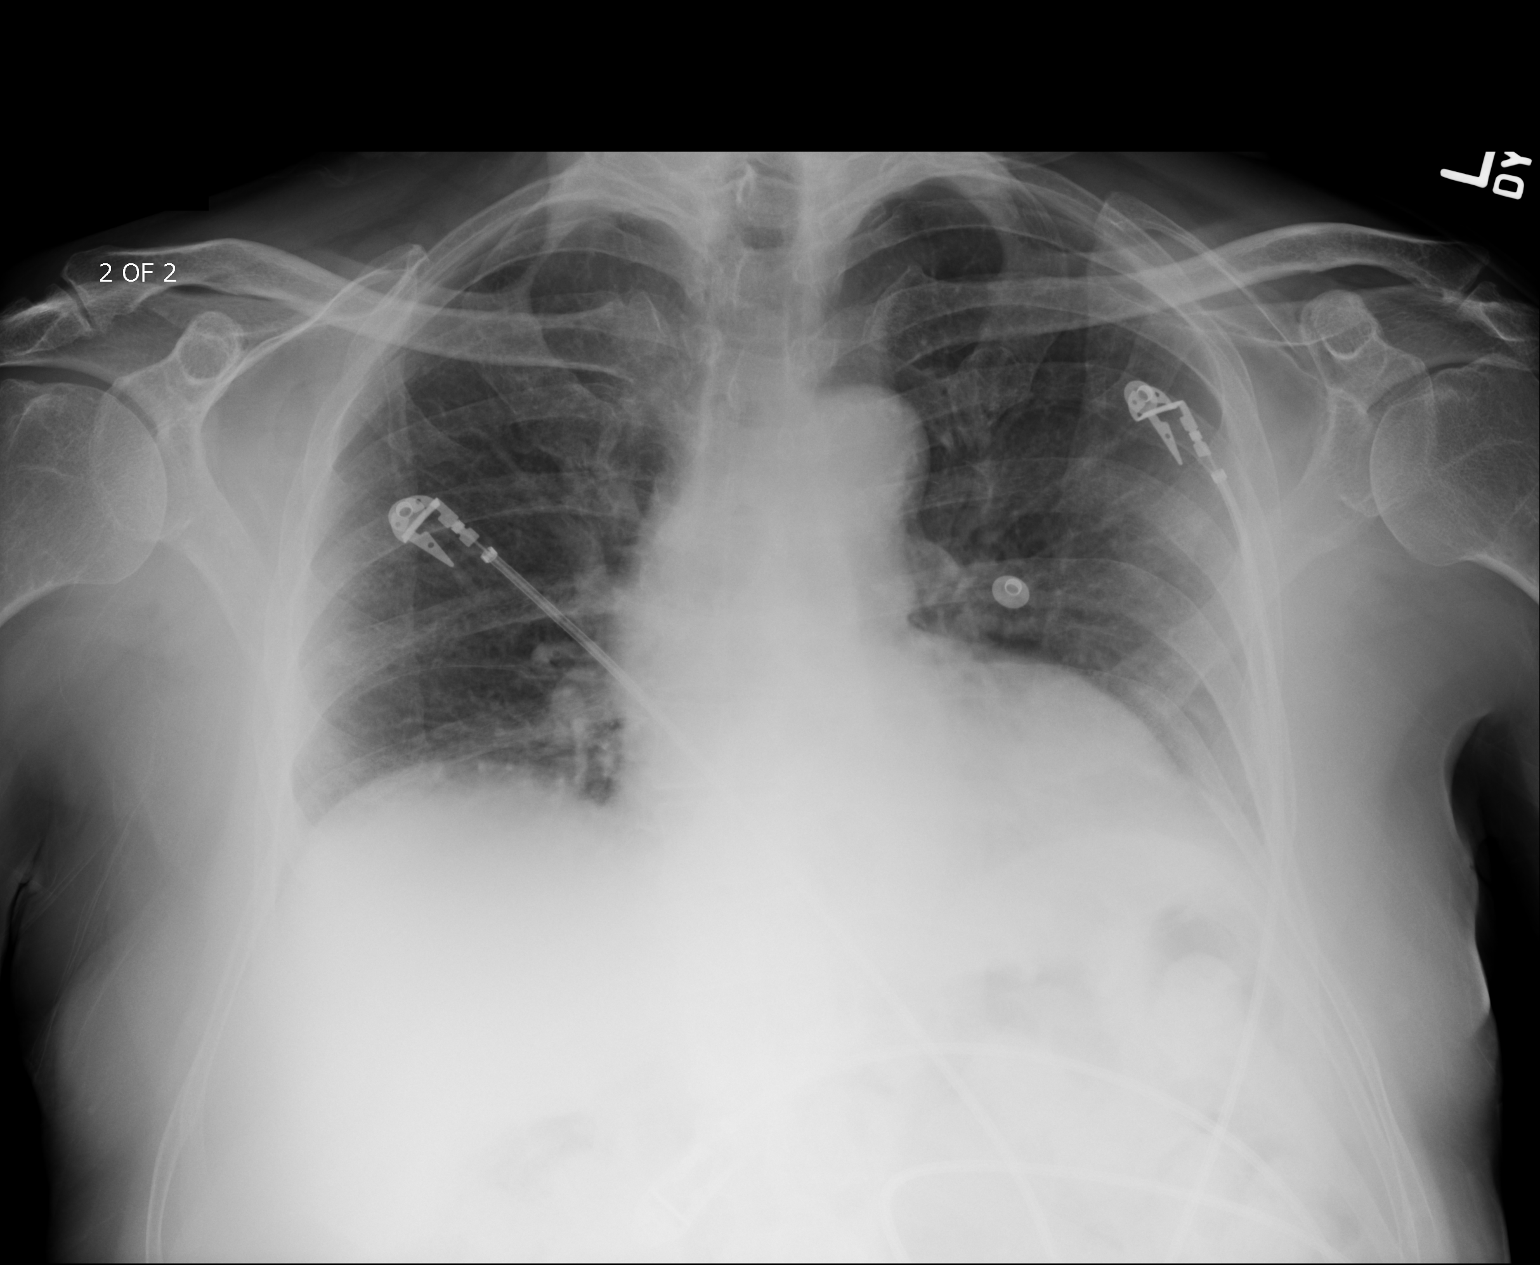

[2 of 2 positions shown; findings below may reference images not displayed]

IMPRESSION: The study is limited due to borderline hypoinflation. There
is no focal infiltrate nor definite evidence of pneumonia. Followup films
with better inspiration would be useful.

[REDACTED]

## 2015-04-15 ENCOUNTER — Other Ambulatory Visit: Payer: Self-pay | Admitting: Internal Medicine

## 2015-04-15 DIAGNOSIS — R1314 Dysphagia, pharyngoesophageal phase: Secondary | ICD-10-CM

## 2015-04-15 DIAGNOSIS — R1013 Epigastric pain: Secondary | ICD-10-CM

## 2015-04-15 DIAGNOSIS — R1310 Dysphagia, unspecified: Secondary | ICD-10-CM

## 2015-04-20 ENCOUNTER — Ambulatory Visit: Payer: PRIVATE HEALTH INSURANCE

## 2015-04-21 ENCOUNTER — Ambulatory Visit
Admission: RE | Admit: 2015-04-21 | Discharge: 2015-04-21 | Disposition: A | Discharge status: Home / Self Care | Payer: Medicare Other | Source: Ambulatory Visit | Attending: Internal Medicine | Admitting: Internal Medicine

## 2015-04-21 DIAGNOSIS — K449 Diaphragmatic hernia without obstruction or gangrene: Secondary | ICD-10-CM | POA: Diagnosis not present

## 2015-04-21 DIAGNOSIS — R1314 Dysphagia, pharyngoesophageal phase: Secondary | ICD-10-CM | POA: Diagnosis present

## 2015-04-21 DIAGNOSIS — R1310 Dysphagia, unspecified: Secondary | ICD-10-CM

## 2015-04-21 DIAGNOSIS — R1013 Epigastric pain: Secondary | ICD-10-CM | POA: Diagnosis present

## 2016-01-01 IMAGING — CR DG CHEST 2V
1 series · 2 of 2 positions shown · non-contrast
Comparison: PA and lateral chest of May 10, 2013

CLINICAL DATA: Pre [HOSPITAL] placement assessment

EXAM:
CHEST  2 VIEW

[Series 1: pa · 0.17mm/px · 2 of 2 slices shown]
[im 1/2]
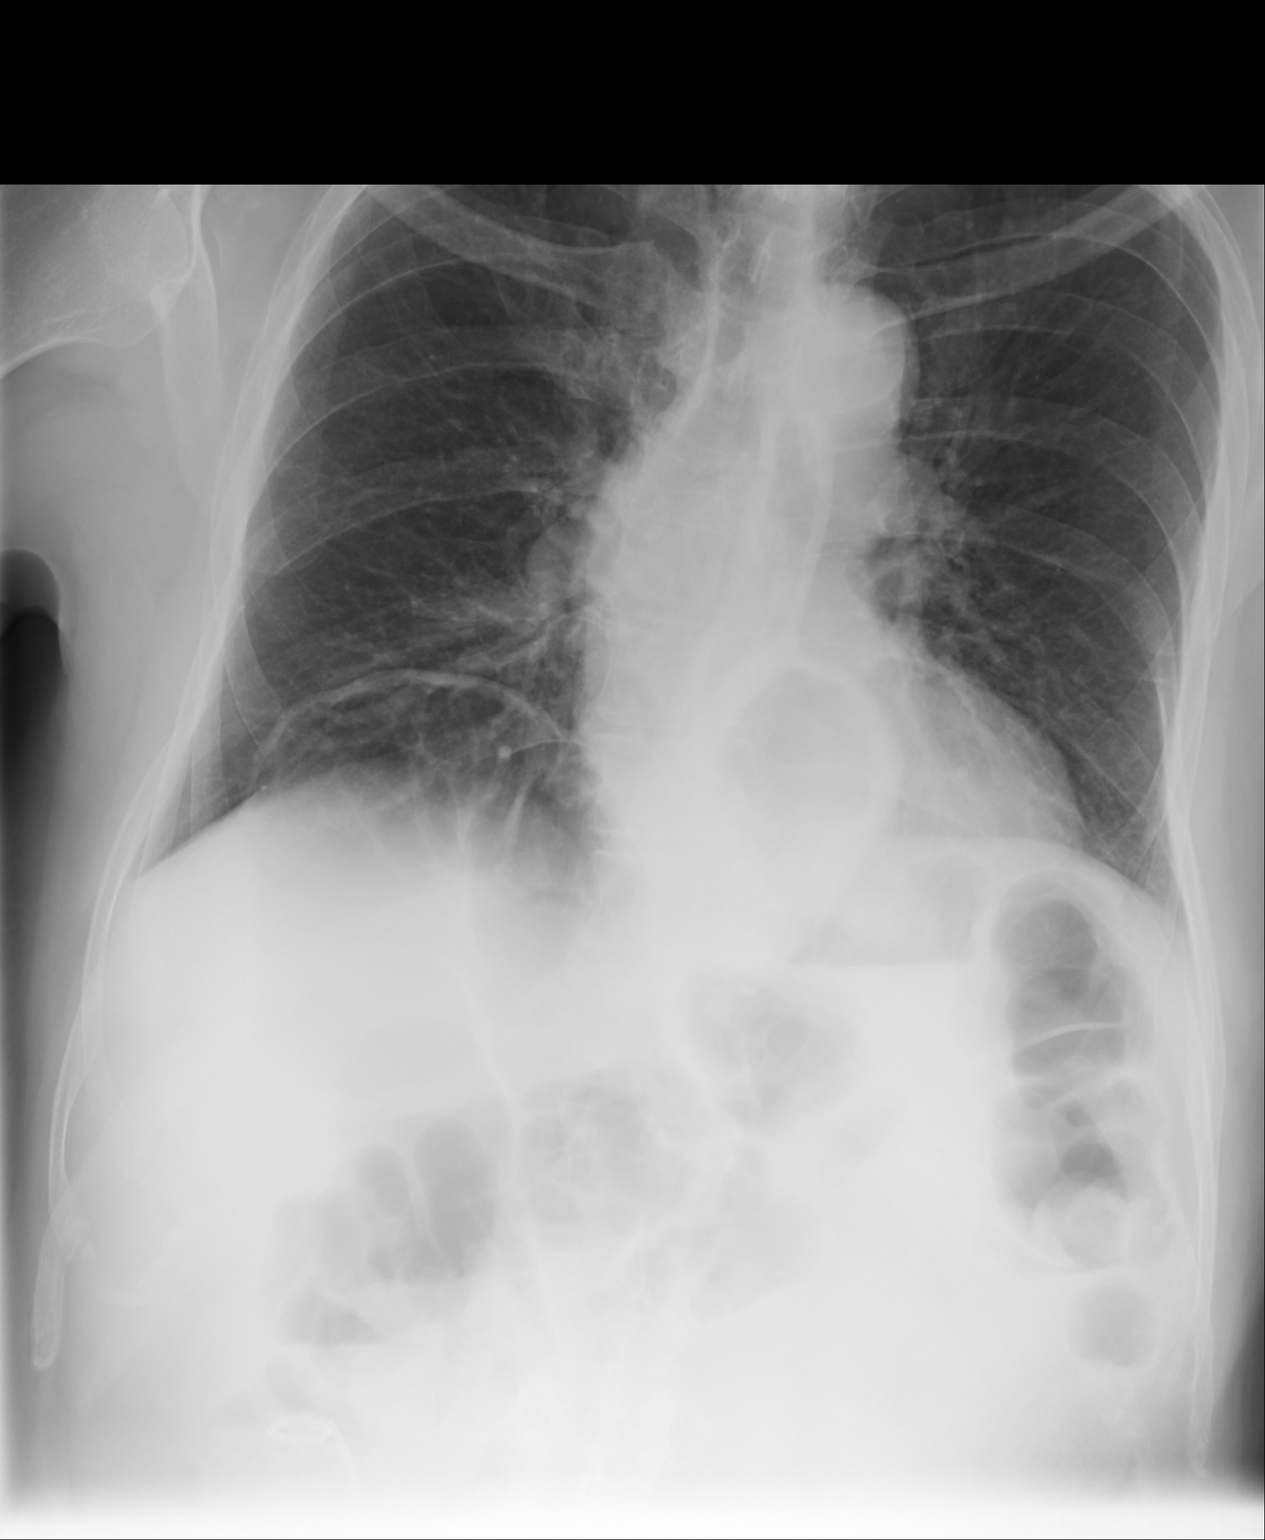
[im 2/2]
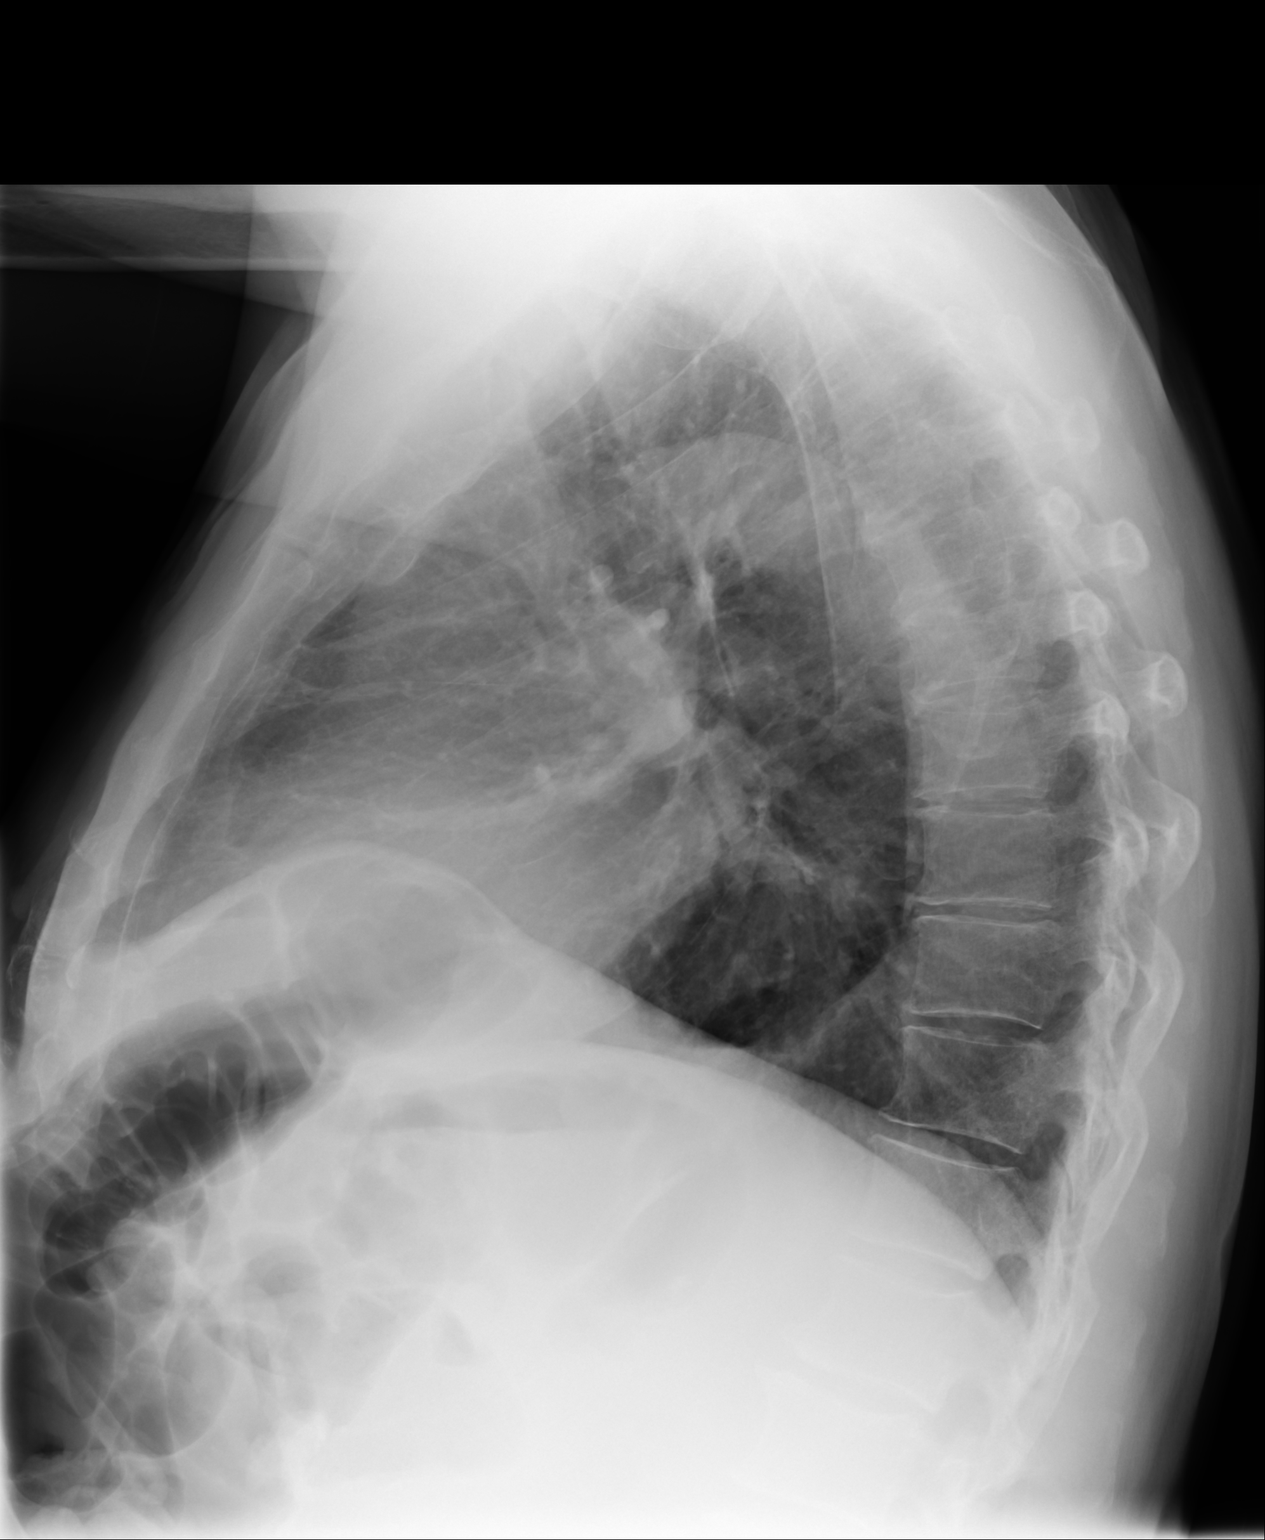

[2 of 2 positions shown; findings below may reference images not displayed]

FINDINGS: The lungs are adequately inflated. There is no focal infiltrate.
There is a moderate-sized hiatal hernia. There is a loop of colon
under the right hemidiaphragm. The heart and pulmonary vascularity
are normal. There is no pleural effusion. The bony thorax is
unremarkable.
IMPRESSION: There is no acute cardiopulmonary abnormality. There is a hiatal
hernia.

## 2016-02-11 IMAGING — CR DG CHEST 2V
1 series · 2 of 2 positions shown · non-contrast
Comparison: 08/30/2013.

CLINICAL DATA: Fell.  Syncope.

EXAM:
CHEST  2 VIEW

[Series 1: w chest lat · 0.14mm/px · 2 of 2 slices shown]
[im 1/2]
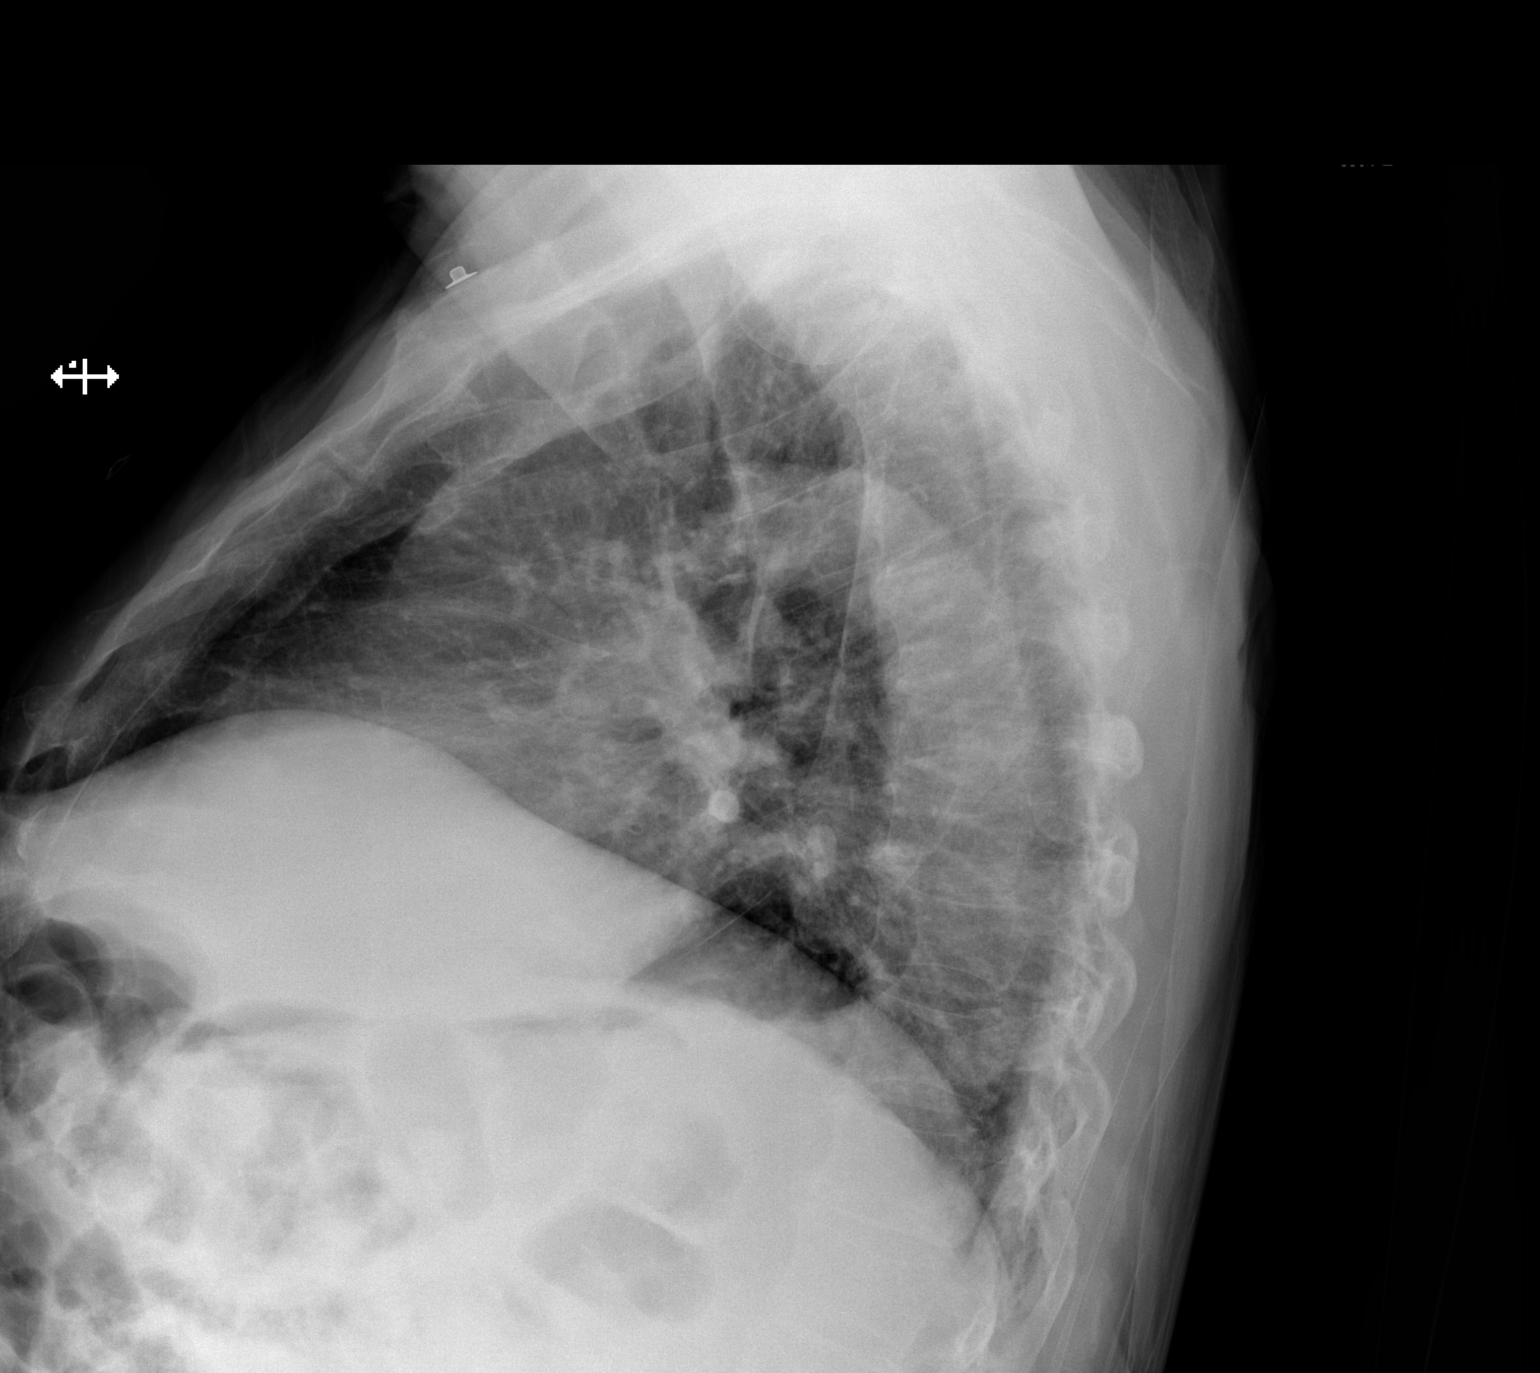
[im 2/2]
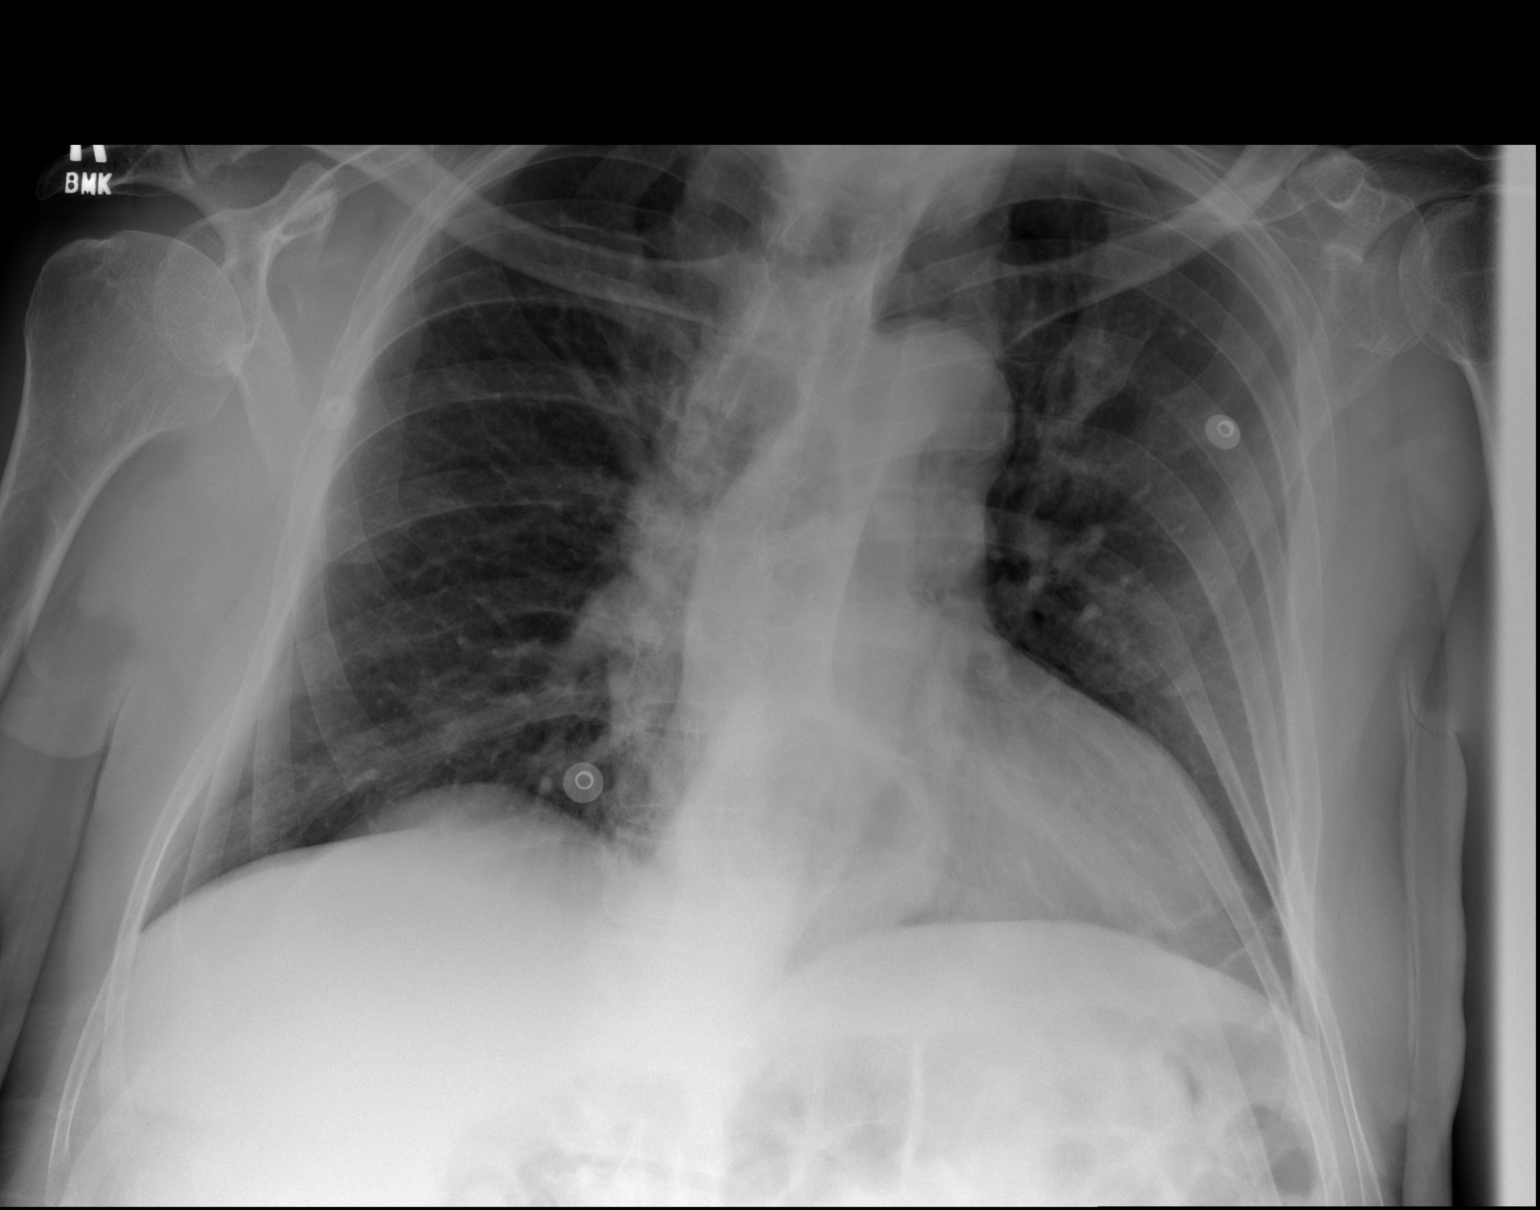

[2 of 2 positions shown; findings below may reference images not displayed]

FINDINGS: The heart is mildly enlarged but stable. There is tortuosity of the
thoracic aorta a small to moderate-sized hiatal hernia is noted. The
lungs are clear. No pleural effusion. The bony thorax is intact.
IMPRESSION: Stable mild cardiac enlargement.

Stable hiatal hernia.

No acute pulmonary findings.

## 2016-02-11 IMAGING — CR DG THORACIC SPINE 2-3V
1 series · 3 of 3 positions shown · non-contrast
Comparison: None.

CLINICAL DATA: Fall.  Back pain.

EXAM:
THORACIC SPINE - 2 VIEW

[Series 1: t thoracic spine ap · 0.14mm/px · 3 of 3 slices shown]
[im 1/3]
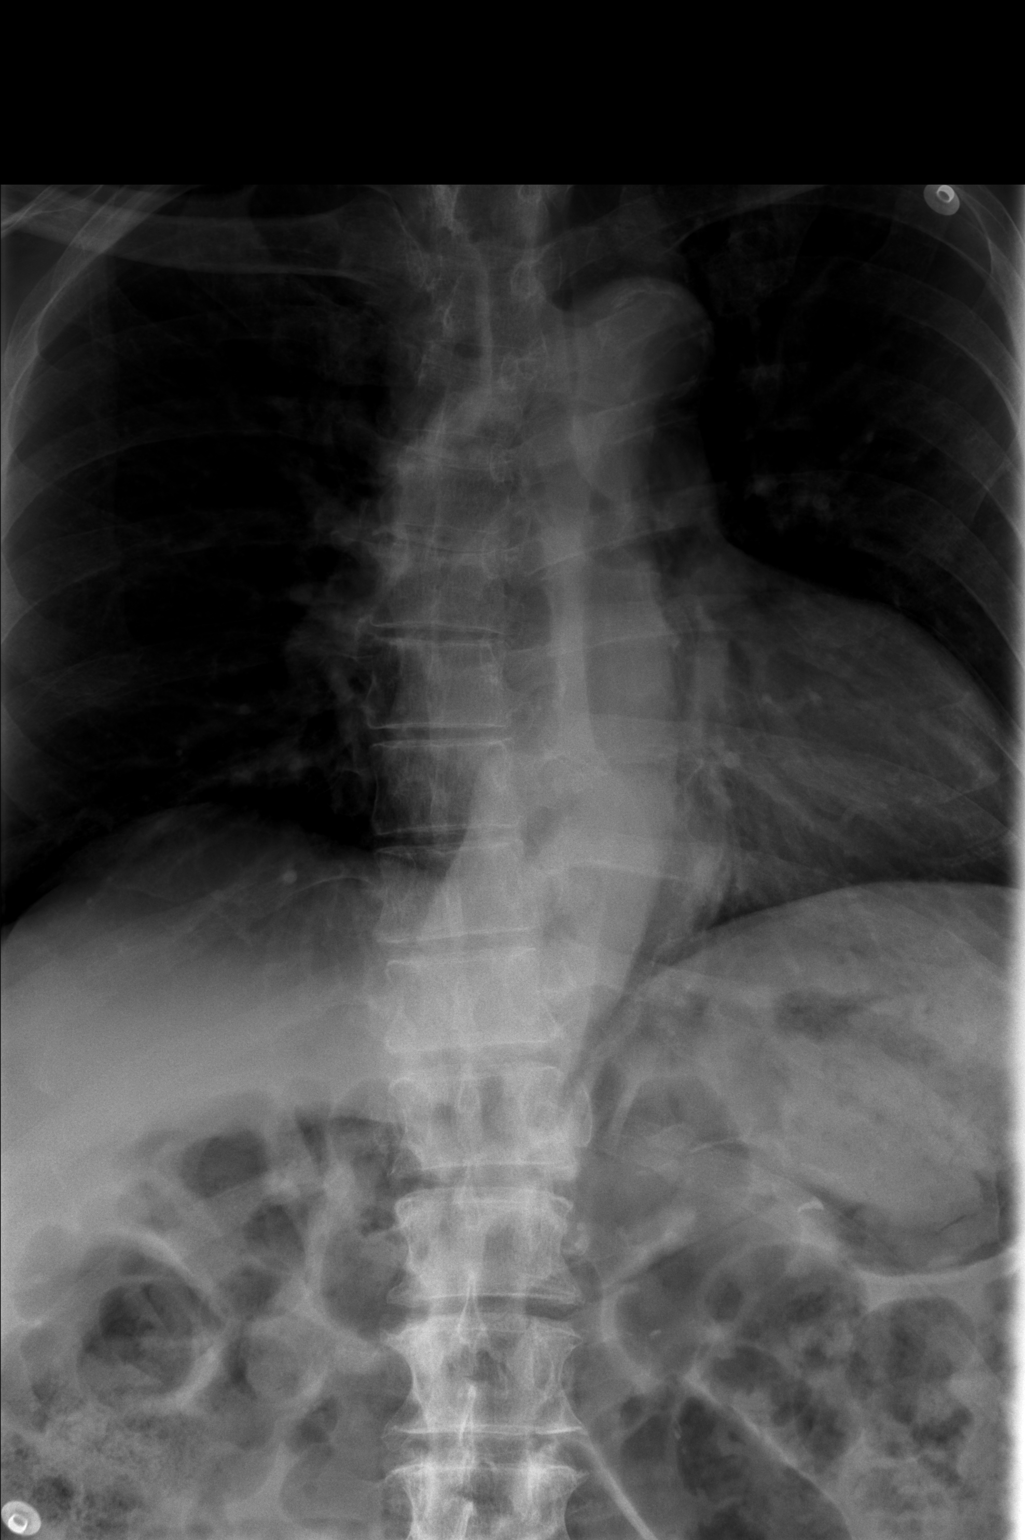
[im 2/3]
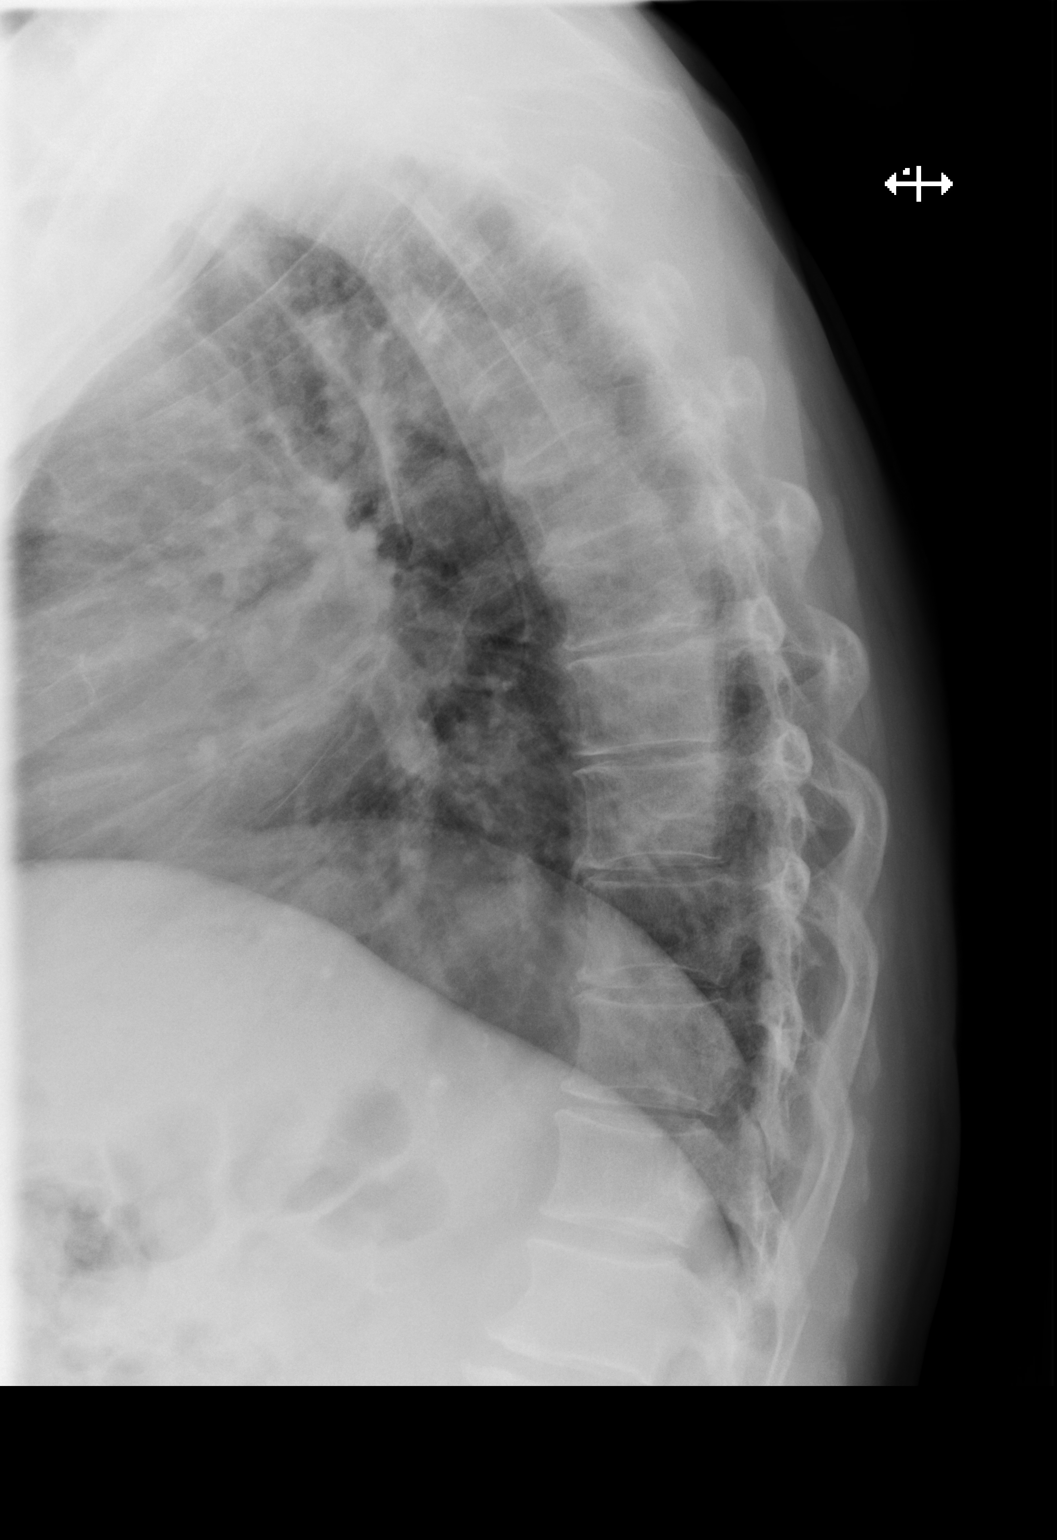
[im 3/3]
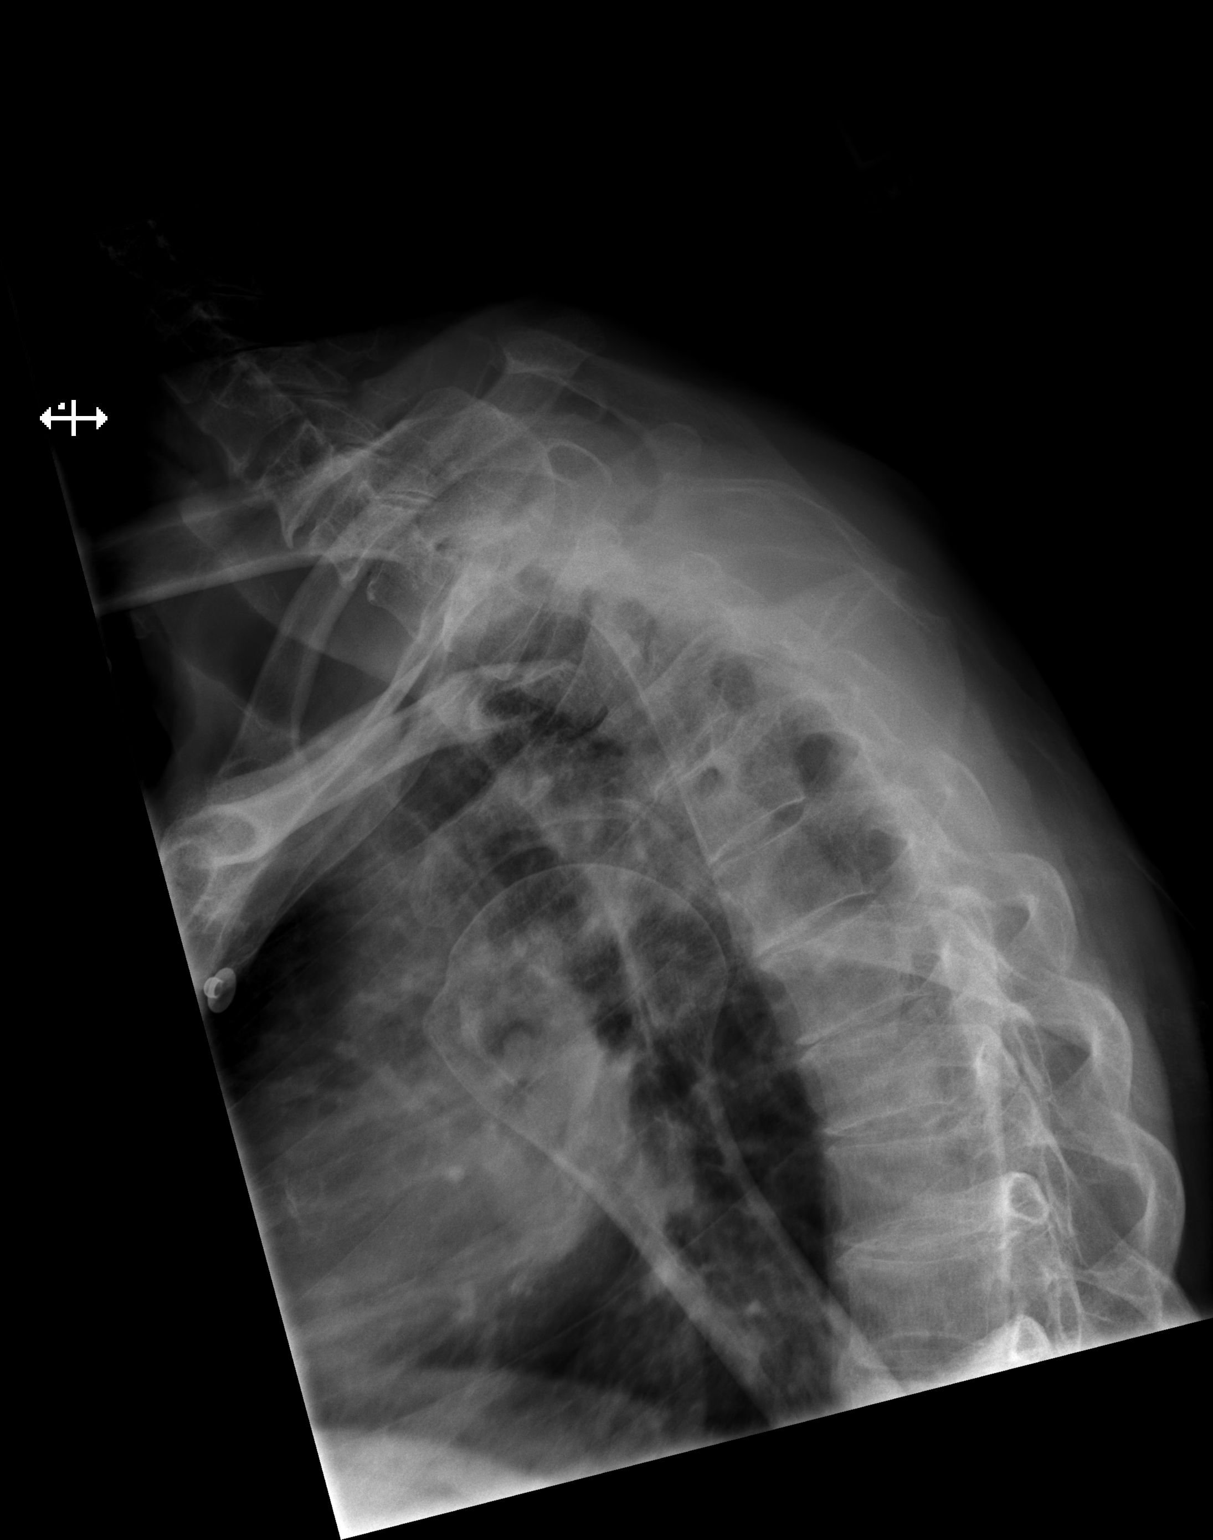

[3 of 3 positions shown; findings below may reference images not displayed]

FINDINGS: Exaggerated thoracic kyphosis without subluxation. There is mild
thoracic dextroscoliosis which could be positional. No evidence of
acute fracture. Diffuse degenerative endplate spurring.
IMPRESSION: No acute osseous findings.

## 2016-09-21 DEATH — deceased
